# Patient Record
Sex: Female | Born: 1965 | Race: Black or African American | Hispanic: No | Marital: Married | Smoking: Never smoker
Health system: Southern US, Community
[De-identification: ages and names within clinical notes are randomized; demographics above are authoritative.]

## PROBLEM LIST (undated history)

## (undated) DIAGNOSIS — K219 Gastro-esophageal reflux disease without esophagitis: Secondary | ICD-10-CM

## (undated) DIAGNOSIS — E785 Hyperlipidemia, unspecified: Secondary | ICD-10-CM

## (undated) DIAGNOSIS — H409 Unspecified glaucoma: Secondary | ICD-10-CM

## (undated) DIAGNOSIS — I10 Essential (primary) hypertension: Secondary | ICD-10-CM

## (undated) HISTORY — DX: Hyperlipidemia, unspecified: E78.5

## (undated) HISTORY — DX: Unspecified glaucoma: H40.9

## (undated) HISTORY — PX: VAGINAL HYSTERECTOMY: SUR661

## (undated) HISTORY — DX: Gastro-esophageal reflux disease without esophagitis: K21.9

## (undated) HISTORY — DX: Essential (primary) hypertension: I10

---

## 1998-08-24 ENCOUNTER — Other Ambulatory Visit: Admission: RE | Admit: 1998-08-24 | Discharge: 1998-08-24 | Payer: Self-pay | Admitting: Gynecology

## 1999-06-10 ENCOUNTER — Other Ambulatory Visit: Admission: RE | Admit: 1999-06-10 | Discharge: 1999-06-10 | Payer: Self-pay | Admitting: Gynecology

## 1999-10-08 ENCOUNTER — Encounter: Admission: RE | Admit: 1999-10-08 | Discharge: 2000-01-06 | Payer: Self-pay | Admitting: Gynecology

## 2000-01-14 ENCOUNTER — Inpatient Hospital Stay (HOSPITAL_COMMUNITY): Admission: AD | Admit: 2000-01-14 | Discharge: 2000-01-14 | Payer: Self-pay | Admitting: Gynecology

## 2000-01-16 ENCOUNTER — Inpatient Hospital Stay (HOSPITAL_COMMUNITY): Admission: AD | Admit: 2000-01-16 | Discharge: 2000-01-18 | Payer: Self-pay | Admitting: Internal Medicine

## 2000-01-16 ENCOUNTER — Encounter (INDEPENDENT_AMBULATORY_CARE_PROVIDER_SITE_OTHER): Payer: Self-pay | Admitting: Specialist

## 2000-11-15 ENCOUNTER — Encounter: Admission: RE | Admit: 2000-11-15 | Discharge: 2000-11-15 | Payer: Self-pay | Admitting: Internal Medicine

## 2000-11-15 ENCOUNTER — Encounter: Payer: Self-pay | Admitting: Internal Medicine

## 2000-12-30 ENCOUNTER — Encounter: Payer: Self-pay | Admitting: Emergency Medicine

## 2000-12-30 ENCOUNTER — Emergency Department (HOSPITAL_COMMUNITY): Admission: EM | Admit: 2000-12-30 | Discharge: 2000-12-30 | Payer: Self-pay | Admitting: Emergency Medicine

## 2002-05-10 ENCOUNTER — Other Ambulatory Visit: Admission: RE | Admit: 2002-05-10 | Discharge: 2002-05-10 | Payer: Self-pay | Admitting: Gynecology

## 2003-05-15 ENCOUNTER — Other Ambulatory Visit: Admission: RE | Admit: 2003-05-15 | Discharge: 2003-05-15 | Payer: Self-pay | Admitting: Gynecology

## 2003-06-25 ENCOUNTER — Observation Stay (HOSPITAL_COMMUNITY): Admission: RE | Admit: 2003-06-25 | Discharge: 2003-06-26 | Payer: Self-pay | Admitting: Gynecology

## 2003-06-25 ENCOUNTER — Encounter (INDEPENDENT_AMBULATORY_CARE_PROVIDER_SITE_OTHER): Payer: Self-pay | Admitting: *Deleted

## 2005-08-02 ENCOUNTER — Encounter: Admission: RE | Admit: 2005-08-02 | Discharge: 2005-08-02 | Payer: Self-pay | Admitting: Family Medicine

## 2005-08-05 ENCOUNTER — Ambulatory Visit: Payer: Self-pay | Admitting: Family Medicine

## 2005-08-10 ENCOUNTER — Ambulatory Visit: Payer: Self-pay | Admitting: Family Medicine

## 2005-08-17 ENCOUNTER — Encounter: Admission: RE | Admit: 2005-08-17 | Discharge: 2005-08-17 | Payer: Self-pay | Admitting: Family Medicine

## 2005-10-26 ENCOUNTER — Ambulatory Visit: Payer: Self-pay | Admitting: Family Medicine

## 2006-07-10 ENCOUNTER — Ambulatory Visit: Payer: Self-pay | Admitting: Family Medicine

## 2006-10-09 ENCOUNTER — Ambulatory Visit: Payer: Self-pay | Admitting: Family Medicine

## 2006-10-17 ENCOUNTER — Ambulatory Visit: Payer: Self-pay | Admitting: Family Medicine

## 2006-10-17 LAB — CONVERTED CEMR LAB
ALT: 29 units/L (ref 0–40)
AST: 22 units/L (ref 0–37)
Alkaline Phosphatase: 54 units/L (ref 39–117)
Basophils Relative: 0.6 % (ref 0.0–1.0)
Bilirubin, Direct: 0.1 mg/dL (ref 0.0–0.3)
Chloride: 107 meq/L (ref 96–112)
Cholesterol: 227 mg/dL (ref 0–200)
Eosinophils Absolute: 0.1 10*3/uL (ref 0.0–0.6)
GFR calc Af Amer: 118 mL/min
GFR calc non Af Amer: 98 mL/min
HCT: 38 % (ref 36.0–46.0)
HDL: 49.4 mg/dL (ref >39.0)
Monocytes Absolute: 0.5 10*3/uL (ref 0.2–0.7)
Neutro Abs: 2.3 10*3/uL (ref 1.4–7.7)
Neutrophils Relative %: 49.7 % (ref 43.0–77.0)
RBC: 4.24 M/uL (ref 3.87–5.11)
Sodium: 142 meq/L (ref 135–145)
TSH: 4.41 microintl units/mL (ref 0.35–5.50)
Total CHOL/HDL Ratio: 4.6
Total Protein: 7.1 g/dL (ref 6.0–8.3)
VLDL: 29 mg/dL (ref 0–40)
WBC: 4.6 10*3/uL (ref 4.5–10.5)

## 2006-10-23 ENCOUNTER — Ambulatory Visit: Payer: Self-pay | Admitting: Family Medicine

## 2006-10-23 ENCOUNTER — Encounter: Payer: Self-pay | Admitting: Family Medicine

## 2006-10-23 DIAGNOSIS — I10 Essential (primary) hypertension: Secondary | ICD-10-CM | POA: Insufficient documentation

## 2006-10-23 DIAGNOSIS — E785 Hyperlipidemia, unspecified: Secondary | ICD-10-CM

## 2006-11-10 ENCOUNTER — Encounter: Admission: RE | Admit: 2006-11-10 | Discharge: 2006-11-10 | Payer: Self-pay | Admitting: Family Medicine

## 2006-12-18 ENCOUNTER — Telehealth: Payer: Self-pay | Admitting: Family Medicine

## 2007-01-10 ENCOUNTER — Telehealth: Payer: Self-pay | Admitting: Family Medicine

## 2007-01-26 ENCOUNTER — Ambulatory Visit: Payer: Self-pay | Admitting: Family Medicine

## 2007-01-29 ENCOUNTER — Telehealth (INDEPENDENT_AMBULATORY_CARE_PROVIDER_SITE_OTHER): Payer: Self-pay | Admitting: *Deleted

## 2007-01-29 LAB — CONVERTED CEMR LAB
Alkaline Phosphatase: 62 units/L (ref 39–117)
Cholesterol: 200 mg/dL (ref 0–200)
Total Bilirubin: 0.9 mg/dL (ref 0.3–1.2)
Total CHOL/HDL Ratio: 3.5
Total Protein: 7.2 g/dL (ref 6.0–8.3)
VLDL: 13 mg/dL (ref 0–40)

## 2007-02-22 ENCOUNTER — Telehealth: Payer: Self-pay | Admitting: Family Medicine

## 2007-03-16 ENCOUNTER — Ambulatory Visit (HOSPITAL_COMMUNITY): Admission: RE | Admit: 2007-03-16 | Discharge: 2007-03-16 | Payer: Self-pay | Admitting: Family Medicine

## 2007-04-18 ENCOUNTER — Ambulatory Visit: Payer: Self-pay | Admitting: Family Medicine

## 2007-04-18 DIAGNOSIS — K219 Gastro-esophageal reflux disease without esophagitis: Secondary | ICD-10-CM | POA: Insufficient documentation

## 2007-05-10 ENCOUNTER — Telehealth: Payer: Self-pay | Admitting: Family Medicine

## 2007-09-26 ENCOUNTER — Telehealth: Payer: Self-pay | Admitting: Family Medicine

## 2008-03-17 ENCOUNTER — Ambulatory Visit (HOSPITAL_COMMUNITY): Admission: RE | Admit: 2008-03-17 | Discharge: 2008-03-17 | Payer: Self-pay | Admitting: Family Medicine

## 2008-05-14 ENCOUNTER — Telehealth: Payer: Self-pay | Admitting: Family Medicine

## 2008-06-17 ENCOUNTER — Telehealth: Payer: Self-pay | Admitting: Family Medicine

## 2008-07-17 ENCOUNTER — Ambulatory Visit: Payer: Self-pay | Admitting: Family Medicine

## 2008-07-17 LAB — CONVERTED CEMR LAB
Ketones, urine, test strip: NEGATIVE
Nitrite: NEGATIVE
Specific Gravity, Urine: 1.02
Urobilinogen, UA: 0.2
WBC Urine, dipstick: NEGATIVE
pH: 7

## 2008-07-21 LAB — CONVERTED CEMR LAB
ALT: 34 units/L (ref 0–35)
AST: 27 units/L (ref 0–37)
Albumin: 4 g/dL (ref 3.5–5.2)
Alkaline Phosphatase: 51 units/L (ref 39–117)
Basophils Absolute: 0 10*3/uL (ref 0.0–0.1)
Calcium: 9.2 mg/dL (ref 8.4–10.5)
Creatinine, Ser: 0.7 mg/dL (ref 0.4–1.2)
Eosinophils Absolute: 0.1 10*3/uL (ref 0.0–0.7)
GFR calc Af Amer: 117 mL/min
GFR calc non Af Amer: 97 mL/min
Hemoglobin: 12.6 g/dL (ref 12.0–15.0)
Lymphocytes Relative: 41 % (ref 12.0–46.0)
MCV: 90.4 fL (ref 78.0–100.0)
Monocytes Absolute: 0.4 10*3/uL (ref 0.1–1.0)
Neutro Abs: 1.8 10*3/uL (ref 1.4–7.7)
Neutrophils Relative %: 46.8 % (ref 43.0–77.0)
Potassium: 3.6 meq/L (ref 3.5–5.1)
RBC: 4.14 M/uL (ref 3.87–5.11)
RDW: 13 % (ref 11.5–14.6)
Sodium: 140 meq/L (ref 135–145)
Total Bilirubin: 0.7 mg/dL (ref 0.3–1.2)
Total CHOL/HDL Ratio: 3.4
Triglycerides: 55 mg/dL (ref 0–149)
VLDL: 11 mg/dL (ref 0–40)

## 2008-07-22 ENCOUNTER — Ambulatory Visit: Payer: Self-pay | Admitting: Family Medicine

## 2008-07-22 DIAGNOSIS — E119 Type 2 diabetes mellitus without complications: Secondary | ICD-10-CM | POA: Insufficient documentation

## 2008-09-29 ENCOUNTER — Telehealth: Payer: Self-pay | Admitting: Family Medicine

## 2008-10-07 ENCOUNTER — Telehealth: Payer: Self-pay | Admitting: Family Medicine

## 2008-11-13 ENCOUNTER — Telehealth: Payer: Self-pay | Admitting: Family Medicine

## 2009-03-26 ENCOUNTER — Ambulatory Visit (HOSPITAL_COMMUNITY): Admission: RE | Admit: 2009-03-26 | Discharge: 2009-03-26 | Payer: Self-pay | Admitting: Family Medicine

## 2009-06-12 ENCOUNTER — Ambulatory Visit: Payer: Self-pay | Admitting: Family Medicine

## 2009-06-12 DIAGNOSIS — G5600 Carpal tunnel syndrome, unspecified upper limb: Secondary | ICD-10-CM | POA: Insufficient documentation

## 2009-08-11 ENCOUNTER — Ambulatory Visit: Payer: Self-pay | Admitting: Family Medicine

## 2009-08-11 LAB — CONVERTED CEMR LAB
Nitrite: NEGATIVE
Protein, U semiquant: NEGATIVE
Urobilinogen, UA: 0.2
WBC Urine, dipstick: NEGATIVE

## 2009-08-12 LAB — CONVERTED CEMR LAB
AST: 19 units/L (ref 0–37)
BUN: 11 mg/dL (ref 6–23)
Basophils Absolute: 0 10*3/uL (ref 0.0–0.1)
Calcium: 9.7 mg/dL (ref 8.4–10.5)
Chloride: 105 meq/L (ref 96–112)
Creatinine, Ser: 0.8 mg/dL (ref 0.4–1.2)
GFR calc non Af Amer: 99.59 mL/min (ref >60)
Glucose, Bld: 125 mg/dL — ABNORMAL HIGH (ref 70–99)
Hemoglobin: 12.1 g/dL (ref 12.0–15.0)
Lymphs Abs: 1.6 10*3/uL (ref 0.7–4.0)
MCV: 93.6 fL (ref 78.0–100.0)
Microalb Creat Ratio: 9.4 mg/g (ref 0.0–30.0)
Monocytes Relative: 9.4 % (ref 3.0–12.0)
Potassium: 4 meq/L (ref 3.5–5.1)
RBC: 3.93 M/uL (ref 3.87–5.11)
RDW: 13.5 % (ref 11.5–14.6)
TSH: 2.74 microintl units/mL (ref 0.35–5.50)
Total Bilirubin: 0.6 mg/dL (ref 0.3–1.2)
WBC: 4.5 10*3/uL (ref 4.5–10.5)

## 2009-08-25 ENCOUNTER — Ambulatory Visit: Payer: Self-pay | Admitting: Family Medicine

## 2009-08-25 LAB — CONVERTED CEMR LAB: Blood Glucose, Fingerstick: 91

## 2010-03-29 ENCOUNTER — Ambulatory Visit (HOSPITAL_COMMUNITY): Admission: RE | Admit: 2010-03-29 | Discharge: 2010-03-29 | Payer: Self-pay | Admitting: Family Medicine

## 2010-06-01 ENCOUNTER — Ambulatory Visit
Admission: RE | Admit: 2010-06-01 | Discharge: 2010-06-01 | Payer: Self-pay | Source: Home / Self Care | Attending: Family Medicine | Admitting: Family Medicine

## 2010-06-01 LAB — CONVERTED CEMR LAB
Blood in Urine, dipstick: NEGATIVE
Ketones, urine, test strip: NEGATIVE
Nitrite: NEGATIVE
Protein, U semiquant: NEGATIVE
WBC Urine, dipstick: NEGATIVE
pH: 6

## 2010-06-11 ENCOUNTER — Telehealth: Payer: Self-pay | Admitting: Family Medicine

## 2010-06-13 ENCOUNTER — Encounter: Payer: Self-pay | Admitting: Family Medicine

## 2010-06-24 NOTE — Assessment & Plan Note (Signed)
Summary: consult re: pain in wrist and elbow/cjr   Vital Signs:  Patient profile:   45 year old female Weight:      195 pounds BMI:     29.98 Temp:     98.3 degrees F oral Pulse rate:   76 / minute BP sitting:   104 / 72  (left arm) Cuff size:   large  Vitals Entered By: Alfred Levins, CMA (June 12, 2009 11:42 AM) CC: rt wrist and elbows feel "stiff"   History of Present Illness: Here for intermittent pains in the right elbow and right hand for the past 6 months. She spends a lot of her time on her computer for work. Sometine the right hand will get numb also. Today they feel fairly good. No swelling.   Current Medications (verified): 1)  Lisinopril-Hydrochlorothiazide 20-25 Mg  Tabs (Lisinopril-Hydrochlorothiazide) .... Once Daily 2)  Simvastatin 80 Mg Tabs (Simvastatin) .... Once Daily 3)  Metformin Hcl 500 Mg Tabs (Metformin Hcl) .... By Mouth Two Times A Day 4)  Freestyle Lite Test  Strp (Glucose Blood) .... Test Once Daily  Allergies (verified): No Known Drug Allergies  Past History:  Past Medical History: Reviewed history from 07/22/2008 and no changes required. Hyperlipidemia Hypertension Diabetes mellitus, type II GERD  Past Surgical History: Reviewed history from 07/22/2008 and no changes required. vaginal Hysterectomy, ovaries intact  Review of Systems  The patient denies anorexia, fever, weight loss, weight gain, vision loss, decreased hearing, hoarseness, chest pain, syncope, dyspnea on exertion, peripheral edema, prolonged cough, headaches, hemoptysis, abdominal pain, melena, hematochezia, severe indigestion/heartburn, hematuria, incontinence, genital sores, muscle weakness, suspicious skin lesions, transient blindness, difficulty walking, depression, unusual weight change, abnormal bleeding, enlarged lymph nodes, angioedema, breast masses, and testicular masses.    Physical Exam  General:  Well-developed,well-nourished,in no acute distress;  alert,appropriate and cooperative throughout examination Extremities:  tender over the right lateral epicondyle. No swelling, full ROM. The right hand is normal. negative Tinels and Phalens.  Neurologic:  strength normal in all extremities and sensation intact to light touch.     Impression & Recommendations:  Problem # 1:  LATERAL EPICONDYLITIS (ICD-726.32)  Problem # 2:  CARPAL TUNNEL SYNDROME (ICD-354.0)  Complete Medication List: 1)  Lisinopril-hydrochlorothiazide 20-25 Mg Tabs (Lisinopril-hydrochlorothiazide) .... Once daily 2)  Simvastatin 80 Mg Tabs (Simvastatin) .... Once daily 3)  Metformin Hcl 500 Mg Tabs (Metformin hcl) .... By mouth two times a day 4)  Freestyle Lite Test Strp (Glucose blood) .... Test once daily 5)  Aleve 220 Mg Tabs (Naproxen sodium) .Marland Kitchen.. 1-2 two times a day as needed  Patient Instructions: 1)  rest the right hand and arms as much as possible. use Aleeve. Wear a wrist spint.  2)  Please schedule a follow-up appointment as needed .

## 2010-06-24 NOTE — Assessment & Plan Note (Signed)
Summary: cpx/cjr/pt rescd//ccm   Vital Signs:  Patient profile:   45 year old female Height:      68.5 inches Weight:      193 pounds BMI:     29.02 Temp:     98.4 degrees F oral BP sitting:   118 / 72  (left arm) Cuff size:   regular  Vitals Entered By: Duard Brady LPN (August 25, 1608 1:54 PM) CC: cpx - doing well , pap ?? cant remember when it was done , mammo 2010 normal Is Patient Diabetic? Yes Did you bring your meter with you today? No CBG Result 91   History of Present Illness: 45 yr old female for cpx. She feels good in general, and she is proud of herself for getting her HTN and lipids and diabetes under good control. She does ask about something that she can take on an as needed basis for anxiety however. For the past year she has been under a lot of stress, since she is a mother and works a full time job. Her husband is still unemployed, and this adds a lot of financial stress to both of them. She sleeps well.   Preventive Screening-Counseling & Management  Alcohol-Tobacco     Smoking Status: quit  Allergies (verified): No Known Drug Allergies  Past History:  Past Medical History: Reviewed history from 07/22/2008 and no changes required. Hyperlipidemia Hypertension Diabetes mellitus, type II GERD  Past Surgical History: Reviewed history from 07/22/2008 and no changes required. vaginal Hysterectomy, ovaries intact  Family History: Reviewed history from 07/22/2008 and no changes required. Family History of Arthritis Family History of CAD Female 1st degree relative <50 Family History Diabetes 1st degree relative Family History High cholesterol Family History Hypertension Family History of Stroke M 1st degree relative <50  Social History: Reviewed history from 07/22/2008 and no changes required. Married Former Smoker Alcohol use-no  Review of Systems  The patient denies anorexia, fever, weight loss, weight gain, vision loss, decreased hearing,  hoarseness, chest pain, syncope, dyspnea on exertion, peripheral edema, prolonged cough, headaches, hemoptysis, abdominal pain, melena, hematochezia, severe indigestion/heartburn, hematuria, incontinence, genital sores, muscle weakness, suspicious skin lesions, transient blindness, difficulty walking, depression, unusual weight change, abnormal bleeding, enlarged lymph nodes, angioedema, breast masses, and testicular masses.    Physical Exam  General:  overweight-appearing.   Head:  Normocephalic and atraumatic without obvious abnormalities. No apparent alopecia or balding. Eyes:  No corneal or conjunctival inflammation noted. EOMI. Perrla. Funduscopic exam benign, without hemorrhages, exudates or papilledema. Vision grossly normal. Ears:  External ear exam shows no significant lesions or deformities.  Otoscopic examination reveals clear canals, tympanic membranes are intact bilaterally without bulging, retraction, inflammation or discharge. Hearing is grossly normal bilaterally. Nose:  External nasal examination shows no deformity or inflammation. Nasal mucosa are pink and moist without lesions or exudates. Mouth:  Oral mucosa and oropharynx without lesions or exudates.  Teeth in good repair. Neck:  No deformities, masses, or tenderness noted. Chest Wall:  No deformities, masses, or tenderness noted. Breasts:  No mass, nodules, thickening, tenderness, bulging, retraction, inflamation, nipple discharge or skin changes noted.   Lungs:  Normal respiratory effort, chest expands symmetrically. Lungs are clear to auscultation, no crackles or wheezes. Heart:  Normal rate and regular rhythm. S1 and S2 normal without gallop, murmur, click, rub or other extra sounds. Abdomen:  Bowel sounds positive,abdomen soft and non-tender without masses, organomegaly or hernias noted. Msk:  No deformity or scoliosis noted of thoracic or  lumbar spine.   Pulses:  R and L carotid,radial,femoral,dorsalis pedis and posterior  tibial pulses are full and equal bilaterally Extremities:  No clubbing, cyanosis, edema, or deformity noted with normal full range of motion of all joints.   Neurologic:  No cranial nerve deficits noted. Station and gait are normal. Plantar reflexes are down-going bilaterally. DTRs are symmetrical throughout. Sensory, motor and coordinative functions appear intact. Skin:  Intact without suspicious lesions or rashes Cervical Nodes:  No lymphadenopathy noted Axillary Nodes:  No palpable lymphadenopathy Inguinal Nodes:  No significant adenopathy Psych:  Cognition and judgment appear intact. Alert and cooperative with normal attention span and concentration. No apparent delusions, illusions, hallucinations   Impression & Recommendations:  Problem # 1:  WELL ADULT EXAM (ICD-V70.0)  Complete Medication List: 1)  Lisinopril-hydrochlorothiazide 20-25 Mg Tabs (Lisinopril-hydrochlorothiazide) .... Once daily 2)  Simvastatin 80 Mg Tabs (Simvastatin) .... Once daily 3)  Metformin Hcl 500 Mg Tabs (Metformin hcl) .... By mouth two times a day 4)  Freestyle Lite Test Strp (Glucose blood) .... Test once daily 5)  Aleve 220 Mg Tabs (Naproxen sodium) .Marland Kitchen.. 1-2 two times a day as needed 6)  Lorazepam 0.5 Mg Tabs (Lorazepam) .... Three times a day as needed anxiety  Other Orders: Capillary Blood Glucose/CBG ([address])  Patient Instructions: 1)  Try Lorazepam as needed . I congratulated her on her excellent dietary and exercise efforts.  2)  Schedule your mammogram.  3)  Please schedule a follow-up appointment in 6 months .  Prescriptions: METFORMIN HCL 500 MG TABS (METFORMIN HCL) by mouth two times a day  #60 x 11   Entered and Authorized by:   Nelwyn Salisbury MD   Signed by:   Nelwyn Salisbury MD on 08/25/2009   Method used:   Electronically to        [facility]  [phone]* (retail)       [address]       [place], [place]  [address]       Ph: [phone] or  [phone]       Fax: [phone]   RxID:   [id number] SIMVASTATIN 80 MG TABS (SIMVASTATIN) once daily  #30 x 11   Entered and Authorized by:   Nelwyn Salisbury MD   Signed by:   Nelwyn Salisbury MD on 08/25/2009   Method used:   Electronically to        [facility]  [phone]* (retail)       [address]       [place], [place]  [address]       Ph: [phone] or [phone]       Fax: [phone]   RxID:   [id number] LISINOPRIL-HYDROCHLOROTHIAZIDE 20-25 MG  TABS (LISINOPRIL-HYDROCHLOROTHIAZIDE) once daily  #30 x 11   Entered and Authorized by:   Nelwyn Salisbury MD   Signed by:   Nelwyn Salisbury MD on 08/25/2009   Method used:   Electronically to        [facility]  [phone]* (retail)       [address]       [place], [place]  [address]       Ph: [phone] or [phone]       Fax: [phone]   RxID:   [id number] LORAZEPAM 0.5 MG TABS (LORAZEPAM) three times a  day as needed anxiety  #60 x 2   Entered and Authorized by:   Nelwyn Salisbury MD   Signed by:   Nelwyn Salisbury MD on 08/25/2009   Method used:   Print then Give to Patient   RxID:   [id number]

## 2010-06-24 NOTE — Assessment & Plan Note (Signed)
Summary: some wheezing/ cold/pt unable to come in sooner/njr   Vital Signs:  Patient profile:   45 year old female Weight:      197 pounds O2 Sat:      97 % Temp:     98.5 degrees F Pulse rate:   97 / minute BP sitting:   120 / 80  (left arm)  Vitals Entered By: Pura Spice, RN (June 01, 2010 4:39 PM) CC: last wk been waking up wheezing and bring up yellow sputum and c/o burning irritation   wants to make sure does not have STD   History of Present Illness: Here for 2 weeks of sinus pressure, PND, ST, and a dry cough. No fever. She also complains of burning when she urinates. No urgency or frequency, no vaginal odor or DC.   Allergies (verified): No Known Drug Allergies  Past History:  Past Medical History: Reviewed history from 07/22/2008 and no changes required. Hyperlipidemia Hypertension Diabetes mellitus, type II GERD  Past Surgical History: Reviewed history from 07/22/2008 and no changes required. vaginal Hysterectomy, ovaries intact  Review of Systems  The patient denies anorexia, fever, weight loss, weight gain, vision loss, decreased hearing, hoarseness, chest pain, syncope, dyspnea on exertion, peripheral edema, hemoptysis, abdominal pain, melena, hematochezia, severe indigestion/heartburn, hematuria, incontinence, genital sores, muscle weakness, suspicious skin lesions, transient blindness, difficulty walking, depression, unusual weight change, abnormal bleeding, enlarged lymph nodes, angioedema, breast masses, and testicular masses.    Physical Exam  General:  Well-developed,well-nourished,in no acute distress; alert,appropriate and cooperative throughout examination Head:  Normocephalic and atraumatic without obvious abnormalities. No apparent alopecia or balding. Eyes:  No corneal or conjunctival inflammation noted. EOMI. Perrla. Funduscopic exam benign, without hemorrhages, exudates or papilledema. Vision grossly normal. Ears:  External ear exam shows  no significant lesions or deformities.  Otoscopic examination reveals clear canals, tympanic membranes are intact bilaterally without bulging, retraction, inflammation or discharge. Hearing is grossly normal bilaterally. Nose:  External nasal examination shows no deformity or inflammation. Nasal mucosa are pink and moist without lesions or exudates. Mouth:  Oral mucosa and oropharynx without lesions or exudates.  Teeth in good repair. Neck:  No deformities, masses, or tenderness noted. Lungs:  Normal respiratory effort, chest expands symmetrically. Lungs are clear to auscultation, no crackles or wheezes.   Impression & Recommendations:  Problem # 1:  ACUTE SINUSITIS, UNSPECIFIED (ICD-461.9)  Her updated medication list for this problem includes:    Zithromax Z-pak 250 Mg Tabs (Azithromycin) .Marland Kitchen... As directed  Problem # 2:  CANDIDIASIS, VAGINAL (ICD-112.1)  Her updated medication list for this problem includes:    Fluconazole 150 Mg Tabs (Fluconazole) .Marland Kitchen... As directed  Orders: UA Dipstick w/o Micro (manual) ([address])  Complete Medication List: 1)  Lisinopril-hydrochlorothiazide 20-25 Mg Tabs (Lisinopril-hydrochlorothiazide) .... Once daily 2)  Simvastatin 80 Mg Tabs (Simvastatin) .... Once daily 3)  Metformin Hcl 500 Mg Tabs (Metformin hcl) .... By mouth two times a day 4)  Freestyle Lite Test Strp (Glucose blood) .... Test once daily 5)  Aleve 220 Mg Tabs (Naproxen sodium) .Marland Kitchen.. 1-2 two times a day as needed 6)  Lorazepam 0.5 Mg Tabs (Lorazepam) .... Three times a day as needed anxiety 7)  Zithromax Z-pak 250 Mg Tabs (Azithromycin) .... As directed 8)  Fluconazole 150 Mg Tabs (Fluconazole) .... As directed  Patient Instructions: 1)  Please schedule a follow-up appointment as needed .  Prescriptions: FLUCONAZOLE 150 MG TABS (FLUCONAZOLE) as directed  #2 x 5   Entered and Authorized  by:   Nelwyn Salisbury MD   Signed by:   Nelwyn Salisbury MD on 06/01/2010   Method used:   Electronically  to        [facility]  [phone]* (retail)       [address]       [place], [place]  [address]       Ph: [phone] or [phone]       Fax: [phone]   RxID:   [id number] ZITHROMAX Z-PAK 250 MG TABS (AZITHROMYCIN) as directed  #1 x 0   Entered and Authorized by:   Nelwyn Salisbury MD   Signed by:   Nelwyn Salisbury MD on 06/01/2010   Method used:   Electronically to        [facility]  [phone]* (retail)       [address]       [place], [place]  [address]       Ph: [phone] or [phone]       Fax: [phone]   RxID:   [id number]    Orders Added: 1)  Est. Patient Level IV [83151] 2)  UA Dipstick w/o Micro (manual) [81002]    Laboratory Results   Urine Tests  Date/Time Received: June 01, 2010 4:53 PM  Date/Time Reported: 4:53 PM   Routine Urinalysis   Color: yellow Appearance: Clear Glucose: negative   (Normal Range: Negative) Bilirubin: negative   (Normal Range: Negative) Ketone: negative   (Normal Range: Negative) Spec. Gravity: 1.020   (Normal Range: 1.003-1.035) Blood: negative   (Normal Range: Negative) pH: 6.0   (Normal Range: 5.0-8.0) Protein: negative   (Normal Range: Negative) Urobilinogen: 0.2   (Normal Range: 0-1) Nitrite: negative   (Normal Range: Negative) Leukocyte Esterace: negative   (Normal Range: Negative)    Comments: Pura Spice, RN  June 01, 2010 4:53 PM

## 2010-06-24 NOTE — Progress Notes (Signed)
Summary: rx augmentin   Phone Note Call from Patient   Caller: Patient Call For: Nelwyn Salisbury MD Summary of Call: Pt's symptoms of sinus infection have returned since finishing Zpack last Sunday.  Would like a stronger antibiotic to [facility] ([address] [phone]. Initial call taken by: Debby Meadows CMA AAMA,  June 11, 2010 8:56 AM  Follow-up for Phone Call        call in Augmentin 875 two times a day for 10 days  Follow-up by: Verlia Kaney A Tim Corriher MD,  June 11, 2010 3:09 PM  Additional Follow-up for Phone Call Additional follow up Details #1::        pt aware med called Additional Follow-up by: Regina G Hudy, RN,  June 11, 2010 3:17 PM    New/Updated Medications: AUGMENTIN 875-125 MG TABS (AMOXICILLIN-POT CLAVULANATE) 1 by mouth two times a day Prescriptions: AUGMENTIN 875-125 MG TABS (AMOXICILLIN-POT CLAVULANATE) 1 by mouth two times a day  #20 x 0   Entered by:   Regina G Hudy, RN   Authorized by:   Tameko Halder A Camiah Humm MD   Signed by:   Regina G Hudy, RN on 06/11/2010   Method used:   Electronically to        [facility]  [id number]* (retail)       [address]       [place], [place]  [address]       Ph: [phone] or [phone]       Fax: [phone]   RxID:   [id number]

## 2010-08-28 ENCOUNTER — Other Ambulatory Visit: Payer: Self-pay | Admitting: Family Medicine

## 2010-09-20 ENCOUNTER — Other Ambulatory Visit: Payer: Self-pay | Admitting: Family Medicine

## 2010-10-05 NOTE — Assessment & Plan Note (Signed)
[facility] OFFICE NOTE   Brittany, [name]               MRN:          [id number]  DATE:10/23/2006                            DOB:          01-03-1966    This is a 45 year old woman here for a complete physical examination.  In general, she feels fine and has no complaints.  She admits to not  following her diet very closely and getting very little exercise.  We  have been following her for hyperlipidemia and hypertension primarily.  Of note, she is status post a hysterectomy.  She has not scheduled her  mammogram as yet for this year.  For other details of her past medical  history, family history, social habits, etc., refer the her last  physical note dated August 10, 2005.   ALLERGIES:  None.   CURRENT MEDICATIONS:  1. HCTZ 25 mg per day.  2. Simvastatin 40 mg per day.   OBJECTIVE:  VITAL SIGNS:  Height 5 feet, 8 inches.  Weight 210.  Blood  pressure 142/92, pulse 76 and regular.  GENERAL:  She is quite overweight.  SKIN:  Clear.  HEENT:  Eyes - sclerae are clear.  Oropharynx clear.  NECK:  Supple without thyromegaly or masses.  CARDIAC:  Rate and rhythm regular without gallops, murmurs or rubs.  Distal pulses are full.  BREASTS:  Breasts and axillae are clear.  ABDOMEN:  Soft, normal bowel sounds, nontender.  No masses.  EXTREMITIES:  No clubbing, cyanosis, or edema.  NEUROLOGIC:  Grossly intact.   LABORATORY DATA:  She was here for fasting laboratories on Oct 17, 2006.  This is remarkable for an abnormal lipid panel with an HDL of 49 but an  LDL of 134.  She also has new onset type 2 diabetes mellitus.  Her  fasting glucose is 143 with a hemoglobin A1c of 7.7.  Otherwise, her  labs were within normal limits.   ASSESSMENT AND PLAN:  1. Complete physical.  We talked about increasing exercise and losing      weight, and also reminded her to set up her mammogram soon.  2. New diagnosis of  type 2 diabetes mellitus.  Will refer her to      nutrition.  At this point, will try to manage it with aggressive      diet and exercise.  Will see her again in 3 months for follow up.  3. Hyperlipidemia.  Will continue on her current medication regimen,      but have the nutritionist      address this, as well.  Will check a lipid panel again in 3 months.  4. Hypertension.  With her new diagnosis of diabetes, will change from      HCTZ to Benicar HCT 20/12.5 to take once daily.  Will follow this      up in 3 months, as well.     Tera Mater. Clent Ridges, MD  Electronically Signed    SAF/MedQ  DD: 10/23/2006  DT: 10/23/2006  Job #: [id number]

## 2010-10-08 NOTE — Discharge Summary (Signed)
[facility]  Patient:    Brittany [name], Brittany [name]                    MRN: [id number] Adm. Date:  [id number] Disc. Date: [id number] Attending:  Tonye Royalty Dictator:   Antony Contras, [facility]                           Discharge Summary  DISCHARGE DIAGNOSES:          1. Intrauterine pregnancy at 39-5/7 weeks.                               2. History of advanced maternal age.                               3. Gestational diabetes controlled with insulin.  PROCEDURES:                   1. Normal spontaneous vaginal delivery over a                                  midline episiotomy.                               2. Postpartum tubal sterilization.  HISTORY OF PRESENT ILLNESS:   The patient is a 45 year old gravida 3, para 2-0-0-2 with an LMP of April 11, 1999 and an [facility] of January 16, 2000.  Her pregnancy was complicated by advanced maternal age.  The patient did decline genetic amnio.  MSAFP was within normal limits.  Also, gestational diabetes, which was controlled with insulin.  PRENATAL LABORATORY DATA:     Blood type AB positive.  Antibody screen negative.  Sickle cell negative.  RPR, HBSAG and HIV nonreactive.  Rubella immune.  HOSPITAL COURSE AND TREATMENT:                The patient was admitted on January 16, 2000 for induction of labor secondary to gestational diabetes.  On admission, the cervix was 3-4 cm, 80% and -1 station.  Blood sugar on admission 74.  She did progress to complete dilatation and delivered an Apgar 9 and 59 female infant weighing 7 pounds and 12 ounces.  She did request and postpartum tubal sterilization was performed post delivery.  Her postoperative course was benign.  She remained afebrile and had no difficulty voiding.  CBC showed hematocrit 29.3, hemoglobin 10, platelets 134.  She was able to be discharged on the second postpartum day in satisfactory condition.  DISPOSITION:                  Followup in six weeks.   Continue prenatal vitamins and iron.  Motrin and Tylox for pain. DD:  02/04/00 TD:  02/06/00 Job: [id number]

## 2010-10-08 NOTE — Discharge Summary (Signed)
NAME:  Brittany [name], Brittany [name]                       ACCOUNT NO.:  [id number]   MEDICAL RECORD NO.:  [id number]                   PATIENT TYPE:  OBV   LOCATION:  9318                                 FACILITY:  [facility]   PHYSICIAN:  Ivor Costa. Farrel Gobble, M.D.              DATE OF BIRTH:  1965-07-07   DATE OF ADMISSION:  06/25/2003  DATE OF DISCHARGE:  06/26/2003                                 DISCHARGE SUMMARY   DISCHARGE DIAGNOSIS:  Symptomatic uterine polyp, menorrhagia, dysmenorrhea,  and dyspareunia.   PROCEDURE:  Lap assisted vaginal hysterectomy.   HISTORY OF PRESENT ILLNESS:  [age]-year-old gravida 4 para 3  contracepting with tubal ligation with complaints of dysmenorrhea,  menorrhagia, dyspareunia.  Patient had been found to have an endometrial  polyp, was offered a hysteroscopy and requested a hysterectomy instead.  Patient states she does have problems with cramping throughout the month and  low abdominal pain as well.  Menses are monthly and they last 5-7 days.   HOSPITAL COURSE:  Patient presented to the hospital on June 25, 2003 for  lap assisted vaginal hysterectomy.  She does smoke a half a pack cigarettes  per day, did well postoperatively and was discharged home on postop day #1.   LABORATORIES:  White count 9.3, hemoglobin 12.4, hematocrit 36, and  platelets 187.   DISPOSITION:  Patient was discharged home, instructed to follow up in the  office in 2 weeks, a pain prescription was given preoperatively,  instructions were given.     Brittany [name], N.P.                      Ivor Costa. Farrel Gobble, M.D.    [name]  D:  07/23/2003  T:  07/23/2003  Job:  [id number]

## 2010-10-08 NOTE — Op Note (Signed)
[facility]  Patient:    Brittany [name], Brittany [name]                    MRN: [id number] Adm. Date:  [id number] Attending:  Tonye Royalty                           Operative Report  INDICATIONS:                  The patient is a 45 year old gravida 3, para 3, status post normal spontaneous vaginal delivery on August 26 requesting elective permanent sterilization. The patient had previously been counseled prenatally, as well as today. Risks, benefits, pros and cons, failure rate, potential complications were all discussed. The patient understands, and ______ to have any more children.  PREOPERATIVE DIAGNOSIS:       Request for immediate postpartum tubal sterilization procedure.  POSTOPERATIVE DIAGNOSIS:      Request for immediate postpartum tubal sterilization procedure.  PROCEDURE PERFORMED:          Bilateral tubal sterilization procedure, Pomeroy technique postpartum.  ANESTHESIA:                   Epidural.  DESCRIPTION OF OPERATION:     After the patient was adequately counseled, she was taken to the operating room. She was placed in a supine position. The abdomen was prepped and draped in the usual sterile fashion. She had been redosed from her epidural and still had some sensitivity in the site of the incision and 1/4% Marcaine 10 cc were infiltrated subcutaneously. After this was accomplished, a semilunar incision was made underneath the umbilicus. The incision was carried down from the skin, subcutaneous tissue and down to the rectus fascia and into the peritoneal cavity. With the use of [employer] retractors for exposure, the left fallopian tube was brought into view and grasped with the ______ clamp and identified to its distal portion. The proximal 1/3 portion was suture ligated with 3-0 plain catgut suture x 2, and this 2 cm segment was excised off the operative field for histological evaluation. The remaining stump was Bovie  cauterized. Similar procedure was carried out on the contralateral side. After ascertaining adequate hemostasis, the fascia was closed with a running stitch of 3-0 Vicryl suture and the subcutaneous tissue was reapproximated with 4-0 plain catgut suture. Blood loss from the procedure was minimal. Fluid resuscitation consisted of 700 cc of lactated Ringers. The patient did receive 1 g of Cefotan preoperatively. She was transferred to the recovery room with stable vital signs. DD:  01/16/00 TD:  01/17/00 Job: [id number]

## 2010-10-08 NOTE — H&P (Signed)
NAME:  Brittany [name], Brittany [name]                       ACCOUNT NO.:  [id number]   MEDICAL RECORD NO.:  [id number]                   PATIENT TYPE:  OBV   LOCATION:  NA                                   FACILITY:  [facility]   PHYSICIAN:  Ivor Costa. Farrel Gobble, M.D.              DATE OF BIRTH:  01-Nov-1965   DATE OF ADMISSION:  DATE OF DISCHARGE:                                HISTORY & PHYSICAL   CHIEF COMPLAINT:  Dysmenorrhea, dyspareunia and menorrhagia.   HISTORY OF PRESENT ILLNESS:  The patient is a 45 year old Gravida IV, Para 3  contracepting with a tubal ligation who complains of dysmenorrhea and  menorrhagia.  The patient was found to have an endometrial polyp.  The  patient states, however, that she also has dyspareunia sometimes severe  enough that it causes her to avoid intercourse and while offered  hysteroscopy had requested perhaps a hysterectomy instead.  The patient  states that sometimes she will cramp throughout the month.  The pain is  pretty much in the lower quadrant on the left, but not always so.  The  patient states that her menses are monthly.  She flows for five to seven  days with clots.   GYN HISTORY:  1. Status post tubal ligation.  2. Three pregnancies.  3. Pregnancy was complicated also by diabetes.   PAST MEDICAL HISTORY:  1. High cholesterol.  2. Gastroesophageal reflux disease.   MEDICATIONS:  1. Protonix.  2. Lovastatin.   PAST SURGICAL HISTORY:  1. Tubal ligation in 2001.   ALLERGIES:  No known drug allergies.   SOCIAL HISTORY:  She smokes roughly a pack per day.  Social alcohol.  No  formal exercise.  Daily caffeine.   FAMILY HISTORY:  Positive for uterine cancer in her grandmother although not  specific.   PHYSICAL EXAMINATION:  GENERAL:  She is well appearing and in no acute  distress.  HEART:  Regular rate.  LUNGS:  Clear to auscultation.  BREASTS:  Without masses, discharge or retractions in the supine upright  position.  ABDOMEN:  Soft and  non-tender without rebound or guarding.  GYN EXAM:  She has a parous introitus.  BUS are negative.  The vagina had a  slight amount of discharge.  The cervix is parous.  The uterus was bulky and  non-tender.  The adnexa are without fullness.  RECTOVAGINAL EXAM:  Confirmatory.   Ultrasound shows her uterus to be slightly bulky withdrawn distinct fibroids  at 9.6 x 5.1 x 6.3 cm with a small echogenic focus in the lower segment.  The adnexa are negative.   ASSESSMENT:  History of dysmenorrhea, menorrhagia and dyspareunia.   PLAN:  The patient would prefer to have a vaginal hysterectomy instead of a  hysteroscopy. We will add a laparoscopic portion to that to rule out any  scar tissue of either fimbria, tubal ligation or endometriosis as the tubal  ligation was done postpartum.  The risks of damage to underlying major  vessels, bladder, ureter and bowel were discussed and accepted.  Because we  cannot rule out endometriosis also as a source, the patient will be bowel  prepped.  All questions were addressed and she will present on the morning  of June 25, 2003 for definitive surgery.                                               Ivor Costa. Farrel Gobble, M.D.    Brittany [name]  D:  06/24/2003  T:  06/24/2003  Job:  [id number]

## 2010-10-08 NOTE — Op Note (Signed)
NAME:  Brittany [name], Brittany [name]                       ACCOUNT NO.:  [id number]   MEDICAL RECORD NO.:  [id number]                   PATIENT TYPE:  OBV   LOCATION:  9318                                 FACILITY:  [facility]   PHYSICIAN:  Ivor Costa. Farrel Gobble, M.D.              DATE OF BIRTH:  1966/05/13   DATE OF PROCEDURE:  06/25/2003  DATE OF DISCHARGE:                                 OPERATIVE REPORT   PREOPERATIVE DIAGNOSES:  1. Dysmenorrhea.  2. Dyspareunia.  3. Menorrhagia.  4. Possible endometrial polyp.   POSTOPERATIVE DIAGNOSES:  1. Dysmenorrhea.  2. Dyspareunia.  3. Menorrhagia.  4. Possible endometrial polyp.   PROCEDURES:  1. Diagnostic laparoscopy.  2. Total vaginal hysterectomy.   SURGEON:  Ivor Costa. Farrel Gobble, M.D.   ASSISTANT:  Rande Brunt. Eda Paschal, M.D.   ANESTHESIA:  General.   ESTIMATED BLOOD LOSS:  Less than 100.   FLUIDS REPLACED:  2 L lactated Ringer's.   URINE OUTPUT:  175 mL of clear urine.   FINDINGS:  The laparoscopic portion:  There is no evidence of endometriosis.  There was no scarring from the previous tubal ligation.  The uterus was  bulky and otherwise mobile.  The lining on the cavity appeared to be  possibly secretory.   PATHOLOGY:  Uterus and cervix.   DESCRIPTION OF PROCEDURE:  The patient was taken to the operating room and  general anesthesia was induced, placed in the dorsal lithotomy position and  prepped and draped in the usual sterile fashion.  A bivalve speculum was  placed in the vagina, the cervix was visualized and stabilized with a single-  tooth tenaculum, after which an infraumbilical incision was made with the  scalpel, going through the previous tubal ligation scar.  The Veress needle  was inserted and free flow of fluid was noted.  Insufflation was continued  until tympany was appreciated above the liver, after which a 10/11  disposable trocar was inserted through the infraumbilical port.  The  placement in the cavity was confirmed.   Once adequate distention was  recreated, the patient was placed in deep Trendelenburg position.  The  uterus was elevated.  It was note to be mobile.  Evidence of the previous  tubal ligation was noted.  Otherwise there was no gross disease that would  be suspicious for cause of her dyspareunia.  Her uterus was bulky.  The gas  was then released, the legs were elevated but not sharply flexed on the  abdominal wall.  A sterile weighted speculum was placed in the vagina.  A  Jacobs tenaculum was placed.  The vaginal mucosa was then injected, a total  of 10 mL of a dilute Pitressin solution.  The vaginal mucosa was then scored  circumferentially around the cervix and gently dissected off.  A posterior  colpotomy was then performed, after which a long sterile weighted speculum  was placed in the vagina.  The uterosacral  ligaments were clamped, suture  ligated with 0 Vicryl, and held for later identification.  Cardinal  ligaments were then also clamped, suture ligated with 0 Vicryl.  An anterior  colpotomy was then performed.  With the puff of gas, we were assured that we  were in the abdominal cavity.  The uterines were then clamped and doubly  suture ligated with 0 Vicryl.  The dissection carried through until the  round and tubo-ovarian ligaments were crossclamped and the specimen was  removed from the cavity.  The tubo-ovarian, round pedicles were, first a  free tie of 0 Vicryl was placed around them, followed by a suture ligature.  This was done bilaterally.  The bowels were then packed away.  The pedicles  were inspected.  The pelvis was irrigated with warm saline.  Pedicles were  noted to be hemostatic.  There was only some minimal bleeding at the cuff.  The posterior cuff was plicated to the peritoneum from just superior to the  uterosacral to just superior to the uterosacral, and this seemed to control  any vaginal bleeding.  The upper pedicles were then cut.  A 2-0 plain was  placed  posteriorly in order to create for a [employer], which was  tied at the end of the procedure.  The vaginal mucosa was then plicated and  noted to be hemostatic afterwards.  The McCall culdoplasty stitch was then  tied.  The patient tolerated the procedure well.  We noticed no further  bleeding in the vagina.  The legs were lowered and the diagnostic scope was  then removed.  The fascia was grasped and closed with a figure-of-eight of 0  Vicryl.  The skin naturally fell together and was therefore closed with  Dermabond.  The patient tolerated the procedure well, sponge, lap, and  needle counts correct x2, and she was transferred to the PACU in stable  condition.                                               Ivor Costa. Farrel Gobble, M.D.    Leda Roys  D:  06/25/2003  T:  06/25/2003  Job:  [id number]

## 2010-12-28 ENCOUNTER — Other Ambulatory Visit: Payer: Self-pay | Admitting: Family Medicine

## 2010-12-28 NOTE — Telephone Encounter (Signed)
Last seen 05/27/10 last written 05/27/10 #60 5RF Please advise

## 2010-12-29 NOTE — Telephone Encounter (Signed)
Call in #60 with 5 rf 

## 2011-01-17 ENCOUNTER — Other Ambulatory Visit (INDEPENDENT_AMBULATORY_CARE_PROVIDER_SITE_OTHER): Payer: 59

## 2011-01-17 DIAGNOSIS — Z0000 Encounter for general adult medical examination without abnormal findings: Secondary | ICD-10-CM

## 2011-01-17 LAB — MICROALBUMIN / CREATININE URINE RATIO
Creatinine,U: 123.2 mg/dL
Microalb Creat Ratio: 0.5 mg/g (ref 0.0–30.0)

## 2011-01-17 LAB — CBC WITH DIFFERENTIAL/PLATELET
Basophils Relative: 0.4 % (ref 0.0–3.0)
Eosinophils Relative: 3.7 % (ref 0.0–5.0)
HCT: 39.4 % (ref 36.0–46.0)
Hemoglobin: 13.3 g/dL (ref 12.0–15.0)
Lymphs Abs: 1.5 10*3/uL (ref 0.7–4.0)
MCV: 94 fl (ref 78.0–100.0)
Monocytes Absolute: 0.3 10*3/uL (ref 0.1–1.0)
Monocytes Relative: 8.7 % (ref 3.0–12.0)
RBC: 4.2 Mil/uL (ref 3.87–5.11)
WBC: 4 10*3/uL — ABNORMAL LOW (ref 4.5–10.5)

## 2011-01-17 LAB — HEPATIC FUNCTION PANEL
AST: 26 U/L (ref 0–37)
Alkaline Phosphatase: 50 U/L (ref 39–117)
Total Bilirubin: 0.3 mg/dL (ref 0.3–1.2)

## 2011-01-17 LAB — BASIC METABOLIC PANEL
BUN: 15 mg/dL (ref 6–23)
CO2: 30 mEq/L (ref 19–32)
Chloride: 99 mEq/L (ref 96–112)
Creatinine, Ser: 1 mg/dL (ref 0.4–1.2)
Potassium: 4 mEq/L (ref 3.5–5.1)

## 2011-01-17 LAB — POCT URINALYSIS DIPSTICK
Blood, UA: NEGATIVE
Glucose, UA: NEGATIVE
Nitrite, UA: NEGATIVE
Protein, UA: NEGATIVE
Spec Grav, UA: 1.025
Urobilinogen, UA: 0.2

## 2011-01-17 LAB — LIPID PANEL
Total CHOL/HDL Ratio: 4
VLDL: 28 mg/dL (ref 0.0–40.0)

## 2011-01-17 LAB — TSH: TSH: 1.67 u[IU]/mL (ref 0.35–5.50)

## 2011-01-17 LAB — HEMOGLOBIN A1C: Hgb A1c MFr Bld: 6.6 % — ABNORMAL HIGH (ref 4.6–6.5)

## 2011-01-21 ENCOUNTER — Encounter: Payer: Self-pay | Admitting: Family Medicine

## 2011-01-21 ENCOUNTER — Telehealth: Payer: Self-pay | Admitting: Family Medicine

## 2011-01-21 MED ORDER — ATORVASTATIN CALCIUM 80 MG PO TABS: 80.0000 mg | ORAL_TABLET | Freq: Every day | ORAL | Status: DC

## 2011-01-21 NOTE — Telephone Encounter (Signed)
Message copied by Baldemar Friday on Fri Jan 21, 2011  9:34 AM ------      Message from: Gershon Crane A      Created: Wed Jan 19, 2011  8:38 AM       Normal except her chol is not well controlled. Stop Simvastatin and switch to Lipitor 80 mg a day. Call in one year supply. Recheck lipids and liver panel in 90 days

## 2011-01-21 NOTE — Telephone Encounter (Signed)
New script sent e-scribe and left message for pt to stop the Simvastatin when she picks up the new medication.

## 2011-01-21 NOTE — Telephone Encounter (Signed)
Left a voice message with below info. Advised pt to call back and let me know that she understands the new directions. I will call new script to drug store.

## 2011-01-25 NOTE — Telephone Encounter (Signed)
Spoke with pt and gave results. 

## 2011-01-27 ENCOUNTER — Encounter: Payer: Self-pay | Admitting: Family Medicine

## 2011-01-27 ENCOUNTER — Ambulatory Visit (INDEPENDENT_AMBULATORY_CARE_PROVIDER_SITE_OTHER): Payer: 59 | Admitting: Family Medicine

## 2011-01-27 VITALS — BP 110/78 | HR 94 | Temp 98.6°F | Ht 67.0 in | Wt 175.0 lb

## 2011-01-27 DIAGNOSIS — Z0000 Encounter for general adult medical examination without abnormal findings: Secondary | ICD-10-CM

## 2011-01-27 MED ORDER — METFORMIN HCL 500 MG PO TABS: 500.0000 mg | ORAL_TABLET | ORAL | Status: DC

## 2011-01-27 MED ORDER — CITALOPRAM HYDROBROMIDE 20 MG PO TABS: 20.0000 mg | ORAL_TABLET | Freq: Every day | ORAL | Status: DC

## 2011-01-27 MED ORDER — LISINOPRIL-HYDROCHLOROTHIAZIDE 20-25 MG PO TABS: 1.0000 | ORAL_TABLET | Freq: Every day | ORAL | Status: DC

## 2011-01-27 MED ORDER — ATORVASTATIN CALCIUM 40 MG PO TABS: 40.0000 mg | ORAL_TABLET | Freq: Every day | ORAL | Status: DC

## 2011-01-27 NOTE — Progress Notes (Signed)
  Subjective:    Patient ID: Brittany [name], female    DOB: November 14, 1965, 45 y.o.   MRN: [id number]  HPI 45 yr old female for a cpx. She feels fine except for daily anxiety. She separated from her husband earlier this year, and this has been very stressful for her to work through. They are trying to work things out but it has been difficult. She has had to use a lot of Lorazepam to get by. Her glucoses have been well controlled and in fact she has had a fair amount of low glucoses throughout the day, dropping into the 60s or 70s. She feels weak and shaky when this happens, and she needs to eat something. She eats very light during the day but then has a heavier dinner at night.    Review of Systems  Constitutional: Negative.   HENT: Negative.   Eyes: Negative.   Respiratory: Negative.   Cardiovascular: Negative.   Gastrointestinal: Negative.   Genitourinary: Negative for dysuria, urgency, frequency, hematuria, flank pain, decreased urine volume, enuresis, difficulty urinating, pelvic pain and dyspareunia.  Musculoskeletal: Negative.   Skin: Negative.   Neurological: Negative.   Hematological: Negative.   Psychiatric/Behavioral: Negative.        Objective:   Physical Exam  Constitutional: She is oriented to person, place, and time. She appears well-developed and well-nourished. No distress.  HENT:  Head: Normocephalic and atraumatic.  Right Ear: External ear normal.  Left Ear: External ear normal.  Nose: Nose normal.  Mouth/Throat: Oropharynx is clear and moist. No oropharyngeal exudate.  Eyes: Conjunctivae and EOM are normal. Pupils are equal, round, and reactive to light. No scleral icterus.  Neck: Normal range of motion. Neck supple. No JVD present. No thyromegaly present.  Cardiovascular: Normal rate, regular rhythm, normal heart sounds and intact distal pulses.  Exam reveals no gallop and no friction rub.   No murmur heard. Pulmonary/Chest: Effort normal and breath sounds  normal. No respiratory distress. She has no wheezes. She has no rales. She exhibits no tenderness.  Abdominal: Soft. Bowel sounds are normal. She exhibits no distension and no mass. There is no tenderness. There is no rebound and no guarding.  Genitourinary: No breast swelling, tenderness, discharge or bleeding.  Musculoskeletal: Normal range of motion. She exhibits no edema and no tenderness.  Lymphadenopathy:    She has no cervical adenopathy.  Neurological: She is alert and oriented to person, place, and time. She has normal reflexes. No cranial nerve deficit. She exhibits normal muscle tone. Coordination normal.  Skin: Skin is warm and dry. No rash noted. No erythema.  Psychiatric: She has a normal mood and affect. Her behavior is normal. Judgment and thought content normal.          Assessment & Plan:  We will start her on Celexa daily for the anxiety. Decrease the Metformin to an evening dose only. Change to Lipitor for the lipids. Recheck labs in 90 days

## 2011-07-04 ENCOUNTER — Other Ambulatory Visit: Payer: Self-pay | Admitting: Family Medicine

## 2011-07-05 NOTE — Telephone Encounter (Signed)
Call in 6 months of each

## 2011-07-06 NOTE — Telephone Encounter (Signed)
Called in.

## 2011-12-31 ENCOUNTER — Other Ambulatory Visit: Payer: Self-pay | Admitting: Family Medicine

## 2012-01-24 ENCOUNTER — Other Ambulatory Visit: Payer: Self-pay | Admitting: Family Medicine

## 2012-02-05 ENCOUNTER — Other Ambulatory Visit: Payer: Self-pay | Admitting: Family Medicine

## 2012-02-07 NOTE — Telephone Encounter (Signed)
Rx last filled 07/04/11 [id number].  Pt last seen 01/27/11. Please advise.

## 2012-02-07 NOTE — Telephone Encounter (Signed)
Call in #60 with 5 rf 

## 2012-02-08 NOTE — Telephone Encounter (Signed)
Call in #60 with 5 rf 

## 2012-02-09 MED ORDER — LORAZEPAM 0.5 MG PO TABS: 0.5000 mg | ORAL_TABLET | Freq: Three times a day (TID) | ORAL | Status: DC | PRN

## 2012-02-09 NOTE — Addendum Note (Signed)
Addended by: Alfred Levins D on: 02/09/2012 03:58 PM   Modules accepted: Orders

## 2012-02-09 NOTE — Telephone Encounter (Signed)
rx called into pharmacy

## 2012-02-29 ENCOUNTER — Other Ambulatory Visit: Payer: Self-pay | Admitting: Family Medicine

## 2012-03-21 ENCOUNTER — Other Ambulatory Visit: Payer: Self-pay | Admitting: Family Medicine

## 2012-03-21 DIAGNOSIS — Z1231 Encounter for screening mammogram for malignant neoplasm of breast: Secondary | ICD-10-CM

## 2012-03-26 ENCOUNTER — Ambulatory Visit (HOSPITAL_COMMUNITY)
Admission: RE | Admit: 2012-03-26 | Discharge: 2012-03-26 | Disposition: A | Discharge status: Home / Self Care | Payer: 59 | Source: Ambulatory Visit | Attending: Family Medicine | Admitting: Family Medicine

## 2012-03-26 DIAGNOSIS — Z1231 Encounter for screening mammogram for malignant neoplasm of breast: Secondary | ICD-10-CM | POA: Insufficient documentation

## 2012-04-04 ENCOUNTER — Other Ambulatory Visit (INDEPENDENT_AMBULATORY_CARE_PROVIDER_SITE_OTHER): Payer: 59

## 2012-04-04 DIAGNOSIS — Z0000 Encounter for general adult medical examination without abnormal findings: Secondary | ICD-10-CM

## 2012-04-04 LAB — BASIC METABOLIC PANEL
Calcium: 9.6 mg/dL (ref 8.4–10.5)
GFR: 98.45 mL/min (ref >60.00)
Glucose, Bld: 138 mg/dL — ABNORMAL HIGH (ref 70–99)
Sodium: 135 mEq/L (ref 135–145)

## 2012-04-04 LAB — CBC WITH DIFFERENTIAL/PLATELET
Basophils Absolute: 0 10*3/uL (ref 0.0–0.1)
Hemoglobin: 13.9 g/dL (ref 12.0–15.0)
Lymphocytes Relative: 39.5 % (ref 12.0–46.0)
Monocytes Relative: 10.5 % (ref 3.0–12.0)
Neutro Abs: 1.9 10*3/uL (ref 1.4–7.7)
RDW: 14.3 % (ref 11.5–14.6)
WBC: 3.9 10*3/uL — ABNORMAL LOW (ref 4.5–10.5)

## 2012-04-04 LAB — POCT URINALYSIS DIPSTICK
Leukocytes, UA: NEGATIVE
Protein, UA: NEGATIVE
Urobilinogen, UA: 0.2

## 2012-04-04 LAB — HEPATIC FUNCTION PANEL
ALT: 42 U/L — ABNORMAL HIGH (ref 0–35)
AST: 31 U/L (ref 0–37)
Total Bilirubin: 0.7 mg/dL (ref 0.3–1.2)
Total Protein: 7.8 g/dL (ref 6.0–8.3)

## 2012-04-04 LAB — MICROALBUMIN / CREATININE URINE RATIO
Creatinine,U: 123.9 mg/dL
Microalb Creat Ratio: 1 mg/g (ref 0.0–30.0)
Microalb, Ur: 1.3 mg/dL (ref 0.0–1.9)

## 2012-04-04 LAB — LIPID PANEL
HDL: 61.7 mg/dL (ref >39.00)
Triglycerides: 120 mg/dL (ref 0.0–149.0)

## 2012-04-04 LAB — LDL CHOLESTEROL, DIRECT: Direct LDL: 175.1 mg/dL

## 2012-04-04 LAB — HEMOGLOBIN A1C: Hgb A1c MFr Bld: 6.4 % (ref 4.6–6.5)

## 2012-04-09 ENCOUNTER — Encounter: Payer: Self-pay | Admitting: Family Medicine

## 2012-04-09 ENCOUNTER — Ambulatory Visit (INDEPENDENT_AMBULATORY_CARE_PROVIDER_SITE_OTHER): Payer: 59 | Admitting: Family Medicine

## 2012-04-09 VITALS — BP 110/74 | HR 100 | Temp 98.6°F | Ht 67.5 in | Wt 182.0 lb

## 2012-04-09 DIAGNOSIS — Z0000 Encounter for general adult medical examination without abnormal findings: Secondary | ICD-10-CM

## 2012-04-09 MED ORDER — LORAZEPAM 1 MG PO TABS: 1.0000 mg | ORAL_TABLET | Freq: Two times a day (BID) | ORAL | Status: DC | PRN

## 2012-04-09 MED ORDER — METFORMIN HCL 500 MG PO TABS: 500.0000 mg | ORAL_TABLET | Freq: Every day | ORAL | Status: DC

## 2012-04-09 MED ORDER — LISINOPRIL-HYDROCHLOROTHIAZIDE 20-25 MG PO TABS: 1.0000 | ORAL_TABLET | Freq: Every day | ORAL | Status: DC

## 2012-04-09 MED ORDER — ATORVASTATIN CALCIUM 80 MG PO TABS: 80.0000 mg | ORAL_TABLET | Freq: Every day | ORAL | Status: DC

## 2012-04-09 NOTE — Progress Notes (Signed)
Quick Note:  Pt is here today for a CPE and Dr. Clent Ridges will go over results. ______

## 2012-04-09 NOTE — Progress Notes (Signed)
  Subjective:    Patient ID: Brittany [name], female    DOB: [birth year], 46 y.o.   MRN: [id number]  HPI 46 yr old female for a cpx. She feels well in general. She could not tolerate Celexa, so she just uses Lorazepam prn.      Review of Systems  Constitutional: Negative.   HENT: Negative.   Eyes: Negative.   Respiratory: Negative.   Cardiovascular: Negative.   Gastrointestinal: Negative.   Genitourinary: Negative for dysuria, urgency, frequency, hematuria, flank pain, decreased urine volume, enuresis, difficulty urinating, pelvic pain and dyspareunia.  Musculoskeletal: Negative.   Skin: Negative.   Neurological: Negative.   Hematological: Negative.   Psychiatric/Behavioral: Negative.        Objective:   Physical Exam  Constitutional: She is oriented to person, place, and time. She appears well-developed and well-nourished. No distress.  HENT:  Head: Normocephalic and atraumatic.  Right Ear: External ear normal.  Left Ear: External ear normal.  Nose: Nose normal.  Mouth/Throat: Oropharynx is clear and moist. No oropharyngeal exudate.  Eyes: Conjunctivae normal and EOM are normal. Pupils are equal, round, and reactive to light. No scleral icterus.  Neck: Normal range of motion. Neck supple. No JVD present. No thyromegaly present.  Cardiovascular: Normal rate, regular rhythm, normal heart sounds and intact distal pulses.  Exam reveals no gallop and no friction rub.   No murmur heard. Pulmonary/Chest: Effort normal and breath sounds normal. No respiratory distress. She has no wheezes. She has no rales. She exhibits no tenderness.  Abdominal: Soft. Bowel sounds are normal. She exhibits no distension and no mass. There is no tenderness. There is no rebound and no guarding.  Genitourinary:       Breasts are normal   Musculoskeletal: Normal range of motion. She exhibits no edema and no tenderness.  Lymphadenopathy:    She has no cervical adenopathy.  Neurological: She is  alert and oriented to person, place, and time. She has normal reflexes. No cranial nerve deficit. She exhibits normal muscle tone. Coordination normal.  Skin: Skin is warm and dry. No rash noted. No erythema.  Psychiatric: She has a normal mood and affect. Her behavior is normal. Judgment and thought content normal.          Assessment & Plan:  Well exam.

## 2012-06-28 ENCOUNTER — Telehealth: Payer: Self-pay | Admitting: Family Medicine

## 2012-06-28 NOTE — Telephone Encounter (Signed)
Patient Information:  Caller Name: [place]  Phone: [phone]  Patient: Brittany [name], Brittany [name]  Gender: Female  DOB: 1965/06/18  Age: 47 Years  PCP: Gershon Crane [facility])  Pregnant: No  Office Follow Up:  Does the office need to follow up with this patient?: No  Instructions For The Office: N/A  RN Note:  Has Appt 9:30 am tomorrow 5/7 but was hoping to get antibiotic so would not have to come to office tomorrow.  Symptoms  Reason For Call & Symptoms: Sinus congestion 2 weeks, pressure behind eyes, ear pain, ear draining.   Chills at intervals for 3-4 days but not taken temperature.  Reviewed Health History In EMR: Yes  Reviewed Medications In EMR: Yes  Reviewed Allergies In EMR: Yes  Reviewed Surgeries / Procedures: Yes  Date of Onset of Symptoms: 06/14/2012 OB / GYN:  LMP: Unknown  Guideline(s) Used:  Sinus Pain and Congestion  Disposition Per Guideline:   See Today in Office  Reason For Disposition Reached:   Earache  Advice Given:  Pain and Fever Medicines:  For pain or fever relief, take either acetaminophen or ibuprofen.  Hydration:  Drink plenty of liquids (6-8 glasses of water daily). If the air in your home is dry, use a cool mist humidifier  Call Back If:   Severe pain lasts longer than 2 hours after pain medicine  You become worse.  Patient Refused Recommendation:  Patient Will Follow Up With Office Later  Has Appt for 9:30 am tomorrow 5/7 for evaluation

## 2012-06-29 ENCOUNTER — Ambulatory Visit (INDEPENDENT_AMBULATORY_CARE_PROVIDER_SITE_OTHER): Payer: 59 | Admitting: Family Medicine

## 2012-06-29 ENCOUNTER — Encounter: Payer: Self-pay | Admitting: Family Medicine

## 2012-06-29 VITALS — BP 110/68 | HR 74 | Temp 98.0°F | Wt 184.0 lb

## 2012-06-29 DIAGNOSIS — J329 Chronic sinusitis, unspecified: Secondary | ICD-10-CM

## 2012-06-29 MED ORDER — FLUCONAZOLE 150 MG PO TABS: 150.0000 mg | ORAL_TABLET | ORAL | Status: DC | PRN

## 2012-06-29 MED ORDER — AMOXICILLIN-POT CLAVULANATE 875-125 MG PO TABS: 1.0000 | ORAL_TABLET | Freq: Two times a day (BID) | ORAL | Status: DC

## 2012-06-29 NOTE — Telephone Encounter (Signed)
She needs to keep the OV

## 2012-06-29 NOTE — Progress Notes (Signed)
  Subjective:    Patient ID: Brittany [name], female    DOB: [birth year], 47 y.o.   MRN: [id number]  HPI Here for 2 weeks of sinus pressure, PND, HA, left ear pain, and a dry cough. No fever. On Mucinex D.    Review of Systems  Constitutional: Negative.   HENT: Positive for ear pain, congestion, postnasal drip and sinus pressure. Negative for hearing loss and tinnitus.   Eyes: Negative.   Respiratory: Positive for cough.        Objective:   Physical Exam  Constitutional: She appears well-developed and well-nourished.  HENT:  Right Ear: External ear normal.  Left Ear: External ear normal.  Nose: Nose normal.  Mouth/Throat: Oropharynx is clear and moist.  Eyes: Conjunctivae normal are normal.  Pulmonary/Chest: Effort normal and breath sounds normal.  Lymphadenopathy:    She has no cervical adenopathy.          Assessment & Plan:  Recheck prn

## 2012-07-11 ENCOUNTER — Encounter: Payer: Self-pay | Admitting: Family Medicine

## 2012-11-14 ENCOUNTER — Other Ambulatory Visit: Payer: Self-pay | Admitting: Family Medicine

## 2012-11-14 NOTE — Telephone Encounter (Signed)
Call in #[phone] with one rf  

## 2013-03-28 ENCOUNTER — Other Ambulatory Visit: Payer: Self-pay

## 2013-04-26 ENCOUNTER — Other Ambulatory Visit: Payer: Self-pay | Admitting: Family Medicine

## 2013-04-29 ENCOUNTER — Other Ambulatory Visit: Payer: Self-pay | Admitting: Family Medicine

## 2013-05-08 ENCOUNTER — Other Ambulatory Visit: Payer: Self-pay | Admitting: Family Medicine

## 2013-05-08 DIAGNOSIS — Z1231 Encounter for screening mammogram for malignant neoplasm of breast: Secondary | ICD-10-CM

## 2013-06-08 ENCOUNTER — Other Ambulatory Visit: Payer: Self-pay | Admitting: Family Medicine

## 2013-06-11 ENCOUNTER — Other Ambulatory Visit: Payer: Self-pay | Admitting: Family Medicine

## 2013-06-11 DIAGNOSIS — Z1231 Encounter for screening mammogram for malignant neoplasm of breast: Secondary | ICD-10-CM

## 2013-06-13 ENCOUNTER — Ambulatory Visit (HOSPITAL_COMMUNITY)
Admission: RE | Admit: 2013-06-13 | Discharge: 2013-06-13 | Disposition: A | Discharge status: Home / Self Care | Payer: 59 | Source: Ambulatory Visit | Attending: Family Medicine | Admitting: Family Medicine

## 2013-06-13 DIAGNOSIS — Z1231 Encounter for screening mammogram for malignant neoplasm of breast: Secondary | ICD-10-CM

## 2013-06-14 ENCOUNTER — Other Ambulatory Visit (INDEPENDENT_AMBULATORY_CARE_PROVIDER_SITE_OTHER): Payer: 59

## 2013-06-14 DIAGNOSIS — Z0000 Encounter for general adult medical examination without abnormal findings: Secondary | ICD-10-CM

## 2013-06-14 LAB — POCT URINALYSIS DIPSTICK
Bilirubin, UA: NEGATIVE
Blood, UA: NEGATIVE
Glucose, UA: NEGATIVE
Ketones, UA: NEGATIVE
LEUKOCYTES UA: NEGATIVE
Nitrite, UA: NEGATIVE
PROTEIN UA: NEGATIVE
Spec Grav, UA: 1.02
Urobilinogen, UA: 0.2
pH, UA: 6

## 2013-06-14 LAB — LIPID PANEL
Cholesterol: 235 mg/dL — ABNORMAL HIGH (ref 0–200)
HDL: 56.4 mg/dL (ref >39.00)
Total CHOL/HDL Ratio: 4
Triglycerides: 167 mg/dL — ABNORMAL HIGH (ref 0.0–149.0)
VLDL: 33.4 mg/dL (ref 0.0–40.0)

## 2013-06-14 LAB — CBC WITH DIFFERENTIAL/PLATELET
BASOS PCT: 0.5 % (ref 0.0–3.0)
Basophils Absolute: 0 10*3/uL (ref 0.0–0.1)
EOS ABS: 0.1 10*3/uL (ref 0.0–0.7)
Eosinophils Relative: 1.4 % (ref 0.0–5.0)
HEMATOCRIT: 37.9 % (ref 36.0–46.0)
HEMOGLOBIN: 12.8 g/dL (ref 12.0–15.0)
LYMPHS ABS: 1.7 10*3/uL (ref 0.7–4.0)
Lymphocytes Relative: 40.3 % (ref 12.0–46.0)
MCHC: 33.7 g/dL (ref 30.0–36.0)
MCV: 89.1 fl (ref 78.0–100.0)
Monocytes Absolute: 0.3 10*3/uL (ref 0.1–1.0)
Monocytes Relative: 8 % (ref 3.0–12.0)
NEUTROS ABS: 2.1 10*3/uL (ref 1.4–7.7)
Neutrophils Relative %: 49.8 % (ref 43.0–77.0)
Platelets: 184 10*3/uL (ref 150.0–400.0)
RBC: 4.26 Mil/uL (ref 3.87–5.11)
RDW: 14.6 % (ref 11.5–14.6)
WBC: 4.1 10*3/uL — ABNORMAL LOW (ref 4.5–10.5)

## 2013-06-14 LAB — BASIC METABOLIC PANEL
BUN: 13 mg/dL (ref 6–23)
CALCIUM: 9.7 mg/dL (ref 8.4–10.5)
CO2: 31 mEq/L (ref 19–32)
Chloride: 99 mEq/L (ref 96–112)
Creatinine, Ser: 0.8 mg/dL (ref 0.4–1.2)
GFR: 99.39 mL/min (ref >60.00)
GLUCOSE: 187 mg/dL — AB (ref 70–99)
Potassium: 4.2 mEq/L (ref 3.5–5.1)
SODIUM: 139 meq/L (ref 135–145)

## 2013-06-14 LAB — HEPATIC FUNCTION PANEL
ALT: 54 U/L — AB (ref 0–35)
AST: 28 U/L (ref 0–37)
Albumin: 4 g/dL (ref 3.5–5.2)
Alkaline Phosphatase: 80 U/L (ref 39–117)
BILIRUBIN DIRECT: 0.1 mg/dL (ref 0.0–0.3)
BILIRUBIN TOTAL: 0.7 mg/dL (ref 0.3–1.2)
TOTAL PROTEIN: 7.4 g/dL (ref 6.0–8.3)

## 2013-06-14 LAB — TSH: TSH: 2.81 u[IU]/mL (ref 0.35–5.50)

## 2013-06-14 LAB — MICROALBUMIN / CREATININE URINE RATIO
Creatinine,U: 141.5 mg/dL
Microalb Creat Ratio: 0.4 mg/g (ref 0.0–30.0)
Microalb, Ur: 0.5 mg/dL (ref 0.0–1.9)

## 2013-06-14 LAB — HEMOGLOBIN A1C: Hgb A1c MFr Bld: 8.6 % — ABNORMAL HIGH (ref 4.6–6.5)

## 2013-06-14 LAB — LDL CHOLESTEROL, DIRECT: LDL DIRECT: 161.7 mg/dL

## 2013-06-18 MED ORDER — METFORMIN HCL 1000 MG PO TABS
1000.0000 mg | ORAL_TABLET | Freq: Two times a day (BID) | ORAL | Status: DC
Start: 2013-06-18 — End: 2014-02-03

## 2013-06-18 NOTE — Addendum Note (Signed)
Addended by: Aniceto BossNIMMONS, SYLVIA A on: 06/18/2013 03:24 PM   Modules accepted: Orders, Medications

## 2013-06-27 ENCOUNTER — Ambulatory Visit (INDEPENDENT_AMBULATORY_CARE_PROVIDER_SITE_OTHER): Payer: 59 | Admitting: Family Medicine

## 2013-06-27 ENCOUNTER — Encounter: Payer: Self-pay | Admitting: Family Medicine

## 2013-06-27 VITALS — BP 130/84 | HR 82 | Temp 98.0°F | Ht 68.0 in | Wt 195.0 lb

## 2013-06-27 DIAGNOSIS — M1990 Unspecified osteoarthritis, unspecified site: Secondary | ICD-10-CM

## 2013-06-27 DIAGNOSIS — Z0000 Encounter for general adult medical examination without abnormal findings: Secondary | ICD-10-CM

## 2013-06-27 DIAGNOSIS — E119 Type 2 diabetes mellitus without complications: Secondary | ICD-10-CM

## 2013-06-27 DIAGNOSIS — M129 Arthropathy, unspecified: Secondary | ICD-10-CM

## 2013-06-27 MED ORDER — GLUCOSE BLOOD VI STRP: ORAL_STRIP | Status: DC

## 2013-06-27 MED ORDER — MELOXICAM 7.5 MG PO TABS: 7.5000 mg | ORAL_TABLET | Freq: Every day | ORAL | Status: DC

## 2013-06-27 NOTE — Progress Notes (Signed)
   Subjective:    Patient ID: Brittany [name], female    DOB: 04/28/1966, 48 y.o.   MRN: [id number]  HPI 48 yr old female for a cpx. She has a few issues to discuss. First she has had a lot of joint pains in the past year and she seems to be developing some arthritis. The right knee has some torn cartilage ans she is seeing Dr. Otelia SergeantNitka for this. She has received a steroid injection and they are considering shots of Synvisc soon. This has kept her from exercising and she has gained weight. Her diabetes has been poorly controlled and her A1c went up to 8.6. She has been having hot flashes and trouble sleeping, but she does not want to try HRT.    Review of Systems  Constitutional: Negative.   HENT: Negative.   Eyes: Negative.   Respiratory: Negative.   Cardiovascular: Negative.   Gastrointestinal: Negative.   Genitourinary: Negative for dysuria, urgency, frequency, hematuria, flank pain, decreased urine volume, enuresis, difficulty urinating, pelvic pain and dyspareunia.  Musculoskeletal: Positive for arthralgias. Negative for back pain, gait problem, joint swelling, myalgias, neck pain and neck stiffness.  Skin: Negative.   Neurological: Negative.   Psychiatric/Behavioral: Negative.        Objective:   Physical Exam  Constitutional: She is oriented to person, place, and time. She appears well-developed and well-nourished. No distress.  HENT:  Head: Normocephalic and atraumatic.  Right Ear: External ear normal.  Left Ear: External ear normal.  Nose: Nose normal.  Mouth/Throat: Oropharynx is clear and moist. No oropharyngeal exudate.  Eyes: Conjunctivae and EOM are normal. Pupils are equal, round, and reactive to light. No scleral icterus.  Neck: Normal range of motion. Neck supple. No JVD present. No thyromegaly present.  Cardiovascular: Normal rate, regular rhythm, normal heart sounds and intact distal pulses.  Exam reveals no gallop and no friction rub.   No murmur  heard. Pulmonary/Chest: Effort normal and breath sounds normal. No respiratory distress. She has no wheezes. She has no rales. She exhibits no tenderness.  Abdominal: Soft. Bowel sounds are normal. She exhibits no distension and no mass. There is no tenderness. There is no rebound and no guarding.  Musculoskeletal: Normal range of motion. She exhibits no edema and no tenderness.  Lymphadenopathy:    She has no cervical adenopathy.  Neurological: She is alert and oriented to person, place, and time. She has normal reflexes. No cranial nerve deficit. She exhibits normal muscle tone. Coordination normal.  Skin: Skin is warm and dry. No rash noted. No erythema.  Psychiatric: She has a normal mood and affect. Her behavior is normal. Judgment and thought content normal.          Assessment & Plan:  Well exam. We will increase Metformin to 1000 mg bid. Get arthritis labs. Try Melatonin 5 mg qhs for sleep. Try black cohosh for the hot flashes.

## 2013-06-27 NOTE — Progress Notes (Signed)
Pre visit review using our clinic review tool, if applicable. No additional management support is needed unless otherwise documented below in the visit note. 

## 2013-06-28 ENCOUNTER — Telehealth: Payer: Self-pay

## 2013-06-28 NOTE — Telephone Encounter (Signed)
Relevant patient education assigned to patient using Emmi. ° °

## 2013-07-12 ENCOUNTER — Encounter: Payer: Self-pay | Admitting: Family Medicine

## 2013-07-22 ENCOUNTER — Other Ambulatory Visit: Payer: Self-pay | Admitting: Family Medicine

## 2013-09-03 ENCOUNTER — Telehealth: Payer: Self-pay | Admitting: Family Medicine

## 2013-09-03 NOTE — Telephone Encounter (Signed)
Pt request refill atorvastatin (LIPITOR) 80 MG tablet [facility].  Pt is out of meds 3 days

## 2013-09-05 MED ORDER — ATORVASTATIN CALCIUM 80 MG PO TABS: ORAL_TABLET | ORAL | Status: DC

## 2013-09-05 NOTE — Telephone Encounter (Signed)
I sent script e-scribe and left a voice message for pt. 

## 2013-10-18 ENCOUNTER — Ambulatory Visit: Payer: 59

## 2013-10-18 ENCOUNTER — Other Ambulatory Visit (INDEPENDENT_AMBULATORY_CARE_PROVIDER_SITE_OTHER): Payer: 59

## 2013-10-18 DIAGNOSIS — E119 Type 2 diabetes mellitus without complications: Secondary | ICD-10-CM

## 2013-10-18 DIAGNOSIS — E785 Hyperlipidemia, unspecified: Secondary | ICD-10-CM

## 2013-10-18 LAB — LIPID PANEL
CHOLESTEROL: 194 mg/dL (ref 0–200)
HDL: 48.4 mg/dL (ref >39.00)
LDL Cholesterol: 116 mg/dL — ABNORMAL HIGH (ref 0–99)
TRIGLYCERIDES: 148 mg/dL (ref 0.0–149.0)
Total CHOL/HDL Ratio: 4
VLDL: 29.6 mg/dL (ref 0.0–40.0)

## 2013-10-18 LAB — HEMOGLOBIN A1C: HEMOGLOBIN A1C: 8.2 % — AB (ref 4.6–6.5)

## 2013-10-21 MED ORDER — GLIPIZIDE 10 MG PO TABS: 10.0000 mg | ORAL_TABLET | Freq: Two times a day (BID) | ORAL | Status: DC

## 2013-10-24 ENCOUNTER — Telehealth: Payer: Self-pay | Admitting: Family Medicine

## 2013-10-24 NOTE — Telephone Encounter (Signed)
Pt is calling about her lab results. She has updated her contact information as well.

## 2013-10-25 NOTE — Telephone Encounter (Signed)
I spoke with pt and gave results.  

## 2013-12-30 ENCOUNTER — Telehealth: Payer: Self-pay | Admitting: *Deleted

## 2013-12-30 DIAGNOSIS — E119 Type 2 diabetes mellitus without complications: Secondary | ICD-10-CM

## 2013-12-30 NOTE — Telephone Encounter (Signed)
Patient to call back and schedule an appointment for follow up diabetes A1c, bmet, microalbumin Diabetic bundle

## 2014-01-27 ENCOUNTER — Other Ambulatory Visit: Payer: Self-pay | Admitting: Family Medicine

## 2014-01-29 NOTE — Telephone Encounter (Signed)
Call in #[phone] with one rf  

## 2014-02-03 ENCOUNTER — Encounter (HOSPITAL_COMMUNITY): Payer: Self-pay | Admitting: Emergency Medicine

## 2014-02-03 ENCOUNTER — Emergency Department (HOSPITAL_COMMUNITY): Payer: 59

## 2014-02-03 ENCOUNTER — Emergency Department (HOSPITAL_COMMUNITY)
Admission: EM | Admit: 2014-02-03 | Discharge: 2014-02-03 | Disposition: A | Discharge status: Home / Self Care | Payer: 59

## 2014-02-03 ENCOUNTER — Emergency Department (HOSPITAL_COMMUNITY)
Admission: EM | Admit: 2014-02-03 | Discharge: 2014-02-03 | Disposition: A | Discharge status: Home / Self Care | Payer: 59 | Attending: Emergency Medicine | Admitting: Emergency Medicine

## 2014-02-03 DIAGNOSIS — R079 Chest pain, unspecified: Secondary | ICD-10-CM | POA: Insufficient documentation

## 2014-02-03 DIAGNOSIS — E119 Type 2 diabetes mellitus without complications: Secondary | ICD-10-CM | POA: Insufficient documentation

## 2014-02-03 DIAGNOSIS — E785 Hyperlipidemia, unspecified: Secondary | ICD-10-CM | POA: Diagnosis not present

## 2014-02-03 DIAGNOSIS — K219 Gastro-esophageal reflux disease without esophagitis: Secondary | ICD-10-CM | POA: Insufficient documentation

## 2014-02-03 DIAGNOSIS — I10 Essential (primary) hypertension: Secondary | ICD-10-CM | POA: Insufficient documentation

## 2014-02-03 DIAGNOSIS — R0789 Other chest pain: Secondary | ICD-10-CM | POA: Diagnosis not present

## 2014-02-03 DIAGNOSIS — Z79899 Other long term (current) drug therapy: Secondary | ICD-10-CM | POA: Insufficient documentation

## 2014-02-03 DIAGNOSIS — Z7982 Long term (current) use of aspirin: Secondary | ICD-10-CM | POA: Insufficient documentation

## 2014-02-03 LAB — D-DIMER, QUANTITATIVE (NOT AT ARMC)

## 2014-02-03 LAB — BASIC METABOLIC PANEL
Anion gap: 14 (ref 5–15)
BUN: 12 mg/dL (ref 6–23)
CHLORIDE: 94 meq/L — AB (ref 96–112)
CO2: 27 meq/L (ref 19–32)
Calcium: 10 mg/dL (ref 8.4–10.5)
Creatinine, Ser: 0.76 mg/dL (ref 0.50–1.10)
GFR calc Af Amer: >90 mL/min (ref >90)
GFR calc non Af Amer: >90 mL/min (ref >90)
GLUCOSE: 185 mg/dL — AB (ref 70–99)
Potassium: 3.7 mEq/L (ref 3.7–5.3)
Sodium: 135 mEq/L — ABNORMAL LOW (ref 137–147)

## 2014-02-03 LAB — CBC
HEMATOCRIT: 36.9 % (ref 36.0–46.0)
HEMOGLOBIN: 12.2 g/dL (ref 12.0–15.0)
MCH: 28.6 pg (ref 26.0–34.0)
MCHC: 33.1 g/dL (ref 30.0–36.0)
MCV: 86.6 fL (ref 78.0–100.0)
Platelets: 231 10*3/uL (ref 150–400)
RBC: 4.26 MIL/uL (ref 3.87–5.11)
RDW: 13.8 % (ref 11.5–15.5)
WBC: 5.4 10*3/uL (ref 4.0–10.5)

## 2014-02-03 LAB — PRO B NATRIURETIC PEPTIDE: Pro B Natriuretic peptide (BNP): 16.7 pg/mL (ref 0–125)

## 2014-02-03 LAB — I-STAT TROPONIN, ED: TROPONIN I, POC: 0 ng/mL (ref 0.00–0.08)

## 2014-02-03 MED ORDER — ASPIRIN 81 MG PO CHEW: 81.0000 mg | CHEWABLE_TABLET | Freq: Every day | ORAL | Status: AC

## 2014-02-03 MED ORDER — PANTOPRAZOLE SODIUM 20 MG PO TBEC: 20.0000 mg | DELAYED_RELEASE_TABLET | Freq: Every day | ORAL | Status: DC

## 2014-02-03 MED ORDER — ASPIRIN EC 81 MG PO TBEC
81.0000 mg | DELAYED_RELEASE_TABLET | Freq: Once | ORAL | Status: AC
  Administered 2014-02-03: 81 mg via ORAL
  Filled 2014-02-03: qty 1

## 2014-02-03 MED ORDER — PANTOPRAZOLE SODIUM 40 MG PO TBEC
40.0000 mg | DELAYED_RELEASE_TABLET | Freq: Once | ORAL | Status: AC
  Administered 2014-02-03: 40 mg via ORAL
  Filled 2014-02-03: qty 1

## 2014-02-03 NOTE — Discharge Instructions (Signed)

## 2014-02-03 NOTE — ED Notes (Addendum)
Pt called x2 no answer, per REG patient LWBS

## 2014-02-03 NOTE — ED Provider Notes (Signed)
CSN: [id number]     Arrival date & time 02/03/14  1738 History   First MD Initiated Contact with Patient 02/03/14 2038     Chief Complaint  Patient presents with  . Chest Pain     (Consider location/radiation/quality/duration/timing/severity/associated sxs/prior Treatment) HPI The patient presents with a two-day history of left-sided chest pain. She reports it has both a burning and a sharp quality to it. It has a waxing and waning in nature. It was there yesterday evening much of the evening. She reports this morning it was improved she then ate breakfast and shortly after it resumed again. She pushes felt mildly short of breath for several days or maybe even longer preceding the onset of pain. She doesn't directly associated shortness of breath with the pain. She denies is made significantly worse by any position changes deep breath or cough. She reports a very modest amount of coughing no sputum. No recent travel history or surgical history.  Past Medical History  Diagnosis Date  . GERD (gastroesophageal reflux disease)   . Hyperlipidemia   . Hypertension   . Diabetes mellitus     type II   Past Surgical History  Procedure Laterality Date  . Vaginal hysterectomy      ovaries intact   Family History  Problem Relation Age of Onset  . Arthritis Other   . Heart disease Other   . Diabetes Other   . Hyperlipidemia Other   . Hypertension Other   . Stroke Other    History  Substance Use Topics  . Smoking status: Never Smoker   . Smokeless tobacco: Never Used  . Alcohol Use: Yes     Comment: 2   OB History   Grav Para Term Preterm Abortions TAB SAB Ect Mult Living                 Review of Systems 10 Systems reviewed and are negative for acute change except as noted in the HPI.  Family history; no early onset of coronary artery disease or sudden death. Both parents are living with control medical problems. Patient has 2 siblings also both living without early coronary  disease sudden death or PE.   Allergies  Review of patient's allergies indicates no known allergies.  Home Medications   Prior to Admission medications   Medication Sig Start Date End Date Taking? Authorizing Provider  atorvastatin (LIPITOR) 80 MG tablet TAKE ONE TABLET BY MOUTH EVERY DAY 09/05/13  Yes Nelwyn Salisbury, MD  lisinopril-hydrochlorothiazide (PRINZIDE,ZESTORETIC) 20-25 MG per tablet TAKE ONE TABLET BY MOUTH ONCE DAILY 07/22/13  Yes Nelwyn Salisbury, MD  metFORMIN (GLUCOPHAGE) 1000 MG tablet Take 1,000 mg by mouth 2 (two) times daily with a meal.   Yes Historical Provider, MD  aspirin 81 MG chewable tablet Chew 1 tablet (81 mg total) by mouth daily. 02/03/14   Arby Barrette, MD  glipiZIDE (GLUCOTROL) 10 MG tablet Take 1 tablet (10 mg total) by mouth 2 (two) times daily before a meal. 10/21/13   Nelwyn Salisbury, MD  pantoprazole (PROTONIX) 20 MG tablet Take 1 tablet (20 mg total) by mouth daily. 02/03/14   Arby Barrette, MD   BP 114/67  Pulse 75  Temp(Src) 98.3 F (36.8 C) (Oral)  Resp 18  SpO2 100% Physical Exam  Constitutional: She is oriented to person, place, and time. She appears well-developed and well-nourished.  HENT:  Head: Normocephalic and atraumatic.  Eyes: EOM are normal. Pupils are equal, round, and reactive to light.  Neck: Neck supple.  Cardiovascular: Normal rate, regular rhythm, normal heart sounds and intact distal pulses.   Pulmonary/Chest: Effort normal and breath sounds normal.  Abdominal: Soft. Bowel sounds are normal. She exhibits no distension. There is no tenderness.  Musculoskeletal: Normal range of motion. She exhibits no edema.  Neurological: She is alert and oriented to person, place, and time. She has normal strength. Coordination normal. GCS eye subscore is 4. GCS verbal subscore is 5. GCS motor subscore is 6.  Skin: Skin is warm, dry and intact.  Psychiatric: She has a normal mood and affect.     ED Course  Procedures (including critical care  time) Labs Review Labs Reviewed  BASIC METABOLIC PANEL - Abnormal; Notable for the following:    Sodium 135 (*)    Chloride 94 (*)    Glucose, Bld 185 (*)    All other components within normal limits  CBC  PRO B NATRIURETIC PEPTIDE  D-DIMER, QUANTITATIVE  I-STAT TROPOININ, ED  CBG MONITORING, ED    Imaging Review Dg Chest 2 View  02/03/2014   CLINICAL DATA:  Chest pain, cough  EXAM: CHEST  2 VIEW  COMPARISON:  None.  FINDINGS: Lungs are clear.  No pleural effusion or pneumothorax.  The heart is normal in size.  Mild degenerative changes of the visualized thoracolumbar spine.  IMPRESSION: No evidence of acute cardiopulmonary disease.   Electronically Signed   By: Charline Bills M.D.   On: 02/03/2014 19:39     EKG Interpretation   Date/Time:  Monday February 03 2014 18:12:32 EDT Ventricular Rate:  73 PR Interval:  208 QRS Duration: 90 QT Interval:  408 QTC Calculation: 450 R Axis:   13 Text Interpretation:  Sinus rhythm Borderline prolonged PR interval no  STEMI Confirmed by Donnald Garre, MD, Lebron Conners [phone]) on 02/03/2014 8:40:46 PM      MDM   Final diagnoses:  Chest pain of uncertain etiology    At this point time diagnostic workup is negative. The patient has 2 days of atypical type chest pain. I have reviewed the plan with her she will start on a daily aspirin as well as protonix. The patient will be calling her family physician tomorrow to discuss scheduling outpatient stress test.    Arby Barrette, MD 02/03/14 2241

## 2014-02-03 NOTE — ED Notes (Signed)
Pt states that she has had L sided burning/sharp pain in her chest x several days along with occasional L arm numbness and SOB. Alert and oriented. NAD at this time.

## 2014-02-04 ENCOUNTER — Telehealth: Payer: Self-pay | Admitting: Family Medicine

## 2014-02-04 LAB — CBG MONITORING, ED: GLUCOSE-CAPILLARY: 143 mg/dL — AB (ref 70–99)

## 2014-02-04 NOTE — Telephone Encounter (Signed)
Per Dr. Clent Ridges pt will need to make a ER follow up and will discuss all issues at that time.

## 2014-02-04 NOTE — Telephone Encounter (Signed)
Pt went to ed last night w/ chest pains. Hospital advised pt to have  pcp schedule a stress test. pls advise if need to make appt  Pt was rx   pantoprazole (PROTONIX) 20 MG tablet

## 2014-02-04 NOTE — Telephone Encounter (Signed)
Pt prefers to get referral for stress test w;out having to come see dr fry.  However, she states dr fry had wanted her to get labs. Pt wil cb and sch lab appt them fup for everything at that time. Pt needs to check her schedule first.

## 2014-02-04 NOTE — Telephone Encounter (Signed)
NO I will not order a stress test sight unseen. She needs to see me so I can determine what the best course of action would be

## 2014-02-05 NOTE — Telephone Encounter (Signed)
Pt needs to schedule a office visit

## 2014-02-05 NOTE — Telephone Encounter (Signed)
Pt aware and will cb once she checks her schedule

## 2014-04-28 ENCOUNTER — Telehealth: Payer: Self-pay | Admitting: Family Medicine

## 2014-04-28 MED ORDER — METFORMIN HCL 1000 MG PO TABS: 1000.0000 mg | ORAL_TABLET | Freq: Two times a day (BID) | ORAL | Status: DC

## 2014-04-28 NOTE — Telephone Encounter (Signed)
Refill request for Metformin 1000 mg take 1 po bid and send to [facility]. I did send script e-scribe.

## 2014-06-06 ENCOUNTER — Encounter: Payer: Self-pay | Admitting: Family Medicine

## 2014-06-06 ENCOUNTER — Ambulatory Visit (INDEPENDENT_AMBULATORY_CARE_PROVIDER_SITE_OTHER): Payer: 59 | Admitting: Family Medicine

## 2014-06-06 VITALS — BP 107/74 | HR 96 | Temp 98.6°F

## 2014-06-06 DIAGNOSIS — N390 Urinary tract infection, site not specified: Secondary | ICD-10-CM

## 2014-06-06 LAB — POCT URINALYSIS DIPSTICK
BILIRUBIN UA: NEGATIVE
Glucose, UA: NEGATIVE
NITRITE UA: NEGATIVE
Spec Grav, UA: >=1.03
Urobilinogen, UA: 0.2
pH, UA: 6

## 2014-06-06 MED ORDER — LISINOPRIL-HYDROCHLOROTHIAZIDE 20-25 MG PO TABS: 1.0000 | ORAL_TABLET | Freq: Every day | ORAL | Status: DC

## 2014-06-06 MED ORDER — METFORMIN HCL 1000 MG PO TABS: 1000.0000 mg | ORAL_TABLET | Freq: Two times a day (BID) | ORAL | Status: DC

## 2014-06-06 MED ORDER — CIPROFLOXACIN HCL 500 MG PO TABS: 500.0000 mg | ORAL_TABLET | Freq: Two times a day (BID) | ORAL | Status: DC

## 2014-06-06 NOTE — Addendum Note (Signed)
Addended by: Aniceto BossNIMMONS, SYLVIA A on: 06/06/2014 04:32 PM   Modules accepted: Orders

## 2014-06-06 NOTE — Progress Notes (Signed)
Pre visit review using our clinic review tool, if applicable. No additional management support is needed unless otherwise documented below in the visit note. 

## 2014-06-06 NOTE — Progress Notes (Signed)
   Subjective:    Patient ID: Brittany [name], female    DOB: 08/29/1965, 49 y.o.   MRN: [id number]  HPI Here for 3 days of urinary frequency and burning, sometimes with some pink in the urine. No fever or back pain. She drinks lots of water.    Review of Systems  Constitutional: Negative.   Genitourinary: Positive for dysuria, urgency and frequency. Negative for flank pain and pelvic pain.       Objective:   Physical Exam  Constitutional: She appears well-developed and well-nourished.  Abdominal: Soft. Bowel sounds are normal. She exhibits no distension and no mass. There is no tenderness. There is no rebound and no guarding.          Assessment & Plan:  Add AZO prn

## 2014-06-09 LAB — URINE CULTURE

## 2014-06-20 ENCOUNTER — Other Ambulatory Visit: Payer: Self-pay | Admitting: Family Medicine

## 2014-06-25 ENCOUNTER — Other Ambulatory Visit: Payer: Self-pay | Admitting: Family Medicine

## 2014-06-25 DIAGNOSIS — Z1231 Encounter for screening mammogram for malignant neoplasm of breast: Secondary | ICD-10-CM

## 2014-06-26 ENCOUNTER — Ambulatory Visit (HOSPITAL_COMMUNITY)
Admission: RE | Admit: 2014-06-26 | Discharge: 2014-06-26 | Disposition: A | Discharge status: Home / Self Care | Payer: 59 | Source: Ambulatory Visit | Attending: Family Medicine | Admitting: Family Medicine

## 2014-06-26 DIAGNOSIS — Z1231 Encounter for screening mammogram for malignant neoplasm of breast: Secondary | ICD-10-CM | POA: Diagnosis present

## 2014-09-12 ENCOUNTER — Encounter: Payer: Self-pay | Admitting: Family Medicine

## 2014-09-12 ENCOUNTER — Ambulatory Visit (INDEPENDENT_AMBULATORY_CARE_PROVIDER_SITE_OTHER): Payer: 59 | Admitting: Family Medicine

## 2014-09-12 VITALS — BP 120/85 | HR 96 | Temp 98.4°F | Ht 68.0 in | Wt 196.0 lb

## 2014-09-12 DIAGNOSIS — N390 Urinary tract infection, site not specified: Secondary | ICD-10-CM | POA: Diagnosis not present

## 2014-09-12 DIAGNOSIS — R208 Other disturbances of skin sensation: Secondary | ICD-10-CM | POA: Diagnosis not present

## 2014-09-12 LAB — POCT URINALYSIS DIPSTICK
Bilirubin, UA: NEGATIVE
Blood, UA: NEGATIVE
GLUCOSE UA: 2.5
Ketones, UA: 40
LEUKOCYTES UA: NEGATIVE
Nitrite, UA: NEGATIVE
UROBILINOGEN UA: 0.2
pH, UA: 6

## 2014-09-12 MED ORDER — CIPROFLOXACIN HCL 500 MG PO TABS: 500.0000 mg | ORAL_TABLET | Freq: Two times a day (BID) | ORAL | Status: DC

## 2014-09-12 NOTE — Progress Notes (Signed)
Pre visit review using our clinic review tool, if applicable. No additional management support is needed unless otherwise documented below in the visit note. 

## 2014-09-12 NOTE — Progress Notes (Signed)
   Subjective:    Patient ID: Brittany [name], female    DOB: 07/13/1965, 49 y.o.   MRN: [id number]  HPI Here for 3 days of urinary urgency and burning. No fever.    Review of Systems  Constitutional: Negative.   Genitourinary: Positive for dysuria, urgency and frequency. Negative for hematuria, flank pain and pelvic pain.       Objective:   Physical Exam  Constitutional: She appears well-developed and well-nourished.  Abdominal: Soft. Bowel sounds are normal. She exhibits no distension and no mass. There is no tenderness. There is no rebound and no guarding.          Assessment & Plan:  Frances Furbishrest the UTI with Cipro. Culture the sample.

## 2014-09-14 LAB — URINE CULTURE
COLONY COUNT: NO GROWTH
Organism ID, Bacteria: NO GROWTH

## 2014-10-25 ENCOUNTER — Other Ambulatory Visit: Payer: Self-pay | Admitting: Family Medicine

## 2014-11-17 ENCOUNTER — Other Ambulatory Visit: Payer: Self-pay

## 2014-11-18 ENCOUNTER — Other Ambulatory Visit: Payer: Self-pay | Admitting: Family Medicine

## 2014-11-19 ENCOUNTER — Other Ambulatory Visit: Payer: Self-pay

## 2014-12-05 ENCOUNTER — Telehealth: Payer: Self-pay | Admitting: Family Medicine

## 2014-12-05 NOTE — Telephone Encounter (Signed)
Pt would like a morning cpe (august is avail) however, would also like to bring in her daughter at the same time for a cpe. She needs well child prior to starting high school .8/22. Is it ok to work them in back to back?

## 2014-12-05 NOTE — Telephone Encounter (Signed)
Pt has been scheduled.  °

## 2014-12-05 NOTE — Telephone Encounter (Signed)
Yes that is okay  

## 2014-12-24 ENCOUNTER — Other Ambulatory Visit: Payer: Self-pay | Admitting: Family Medicine

## 2014-12-26 NOTE — Telephone Encounter (Signed)
Patient called for a RX refill on Glipizide to be sent to the [facility] on [address].

## 2014-12-26 NOTE — Telephone Encounter (Signed)
I called and left a voice message for pt to schedule lab appointment, orders are in computer. I did send in script for a 1 month supply e-scribe.

## 2014-12-29 ENCOUNTER — Other Ambulatory Visit (INDEPENDENT_AMBULATORY_CARE_PROVIDER_SITE_OTHER): Payer: 59

## 2014-12-29 DIAGNOSIS — Z0000 Encounter for general adult medical examination without abnormal findings: Secondary | ICD-10-CM

## 2014-12-29 LAB — BASIC METABOLIC PANEL
BUN: 12 mg/dL (ref 6–23)
CO2: 29 mEq/L (ref 19–32)
CREATININE: 0.83 mg/dL (ref 0.40–1.20)
Calcium: 9.8 mg/dL (ref 8.4–10.5)
Chloride: 99 mEq/L (ref 96–112)
GFR: 93.3 mL/min (ref >60.00)
Glucose, Bld: 248 mg/dL — ABNORMAL HIGH (ref 70–99)
POTASSIUM: 4.2 meq/L (ref 3.5–5.1)
Sodium: 136 mEq/L (ref 135–145)

## 2014-12-29 LAB — CBC WITH DIFFERENTIAL/PLATELET
BASOS PCT: 0.5 % (ref 0.0–3.0)
Basophils Absolute: 0 10*3/uL (ref 0.0–0.1)
Eosinophils Absolute: 0.1 10*3/uL (ref 0.0–0.7)
Eosinophils Relative: 1.6 % (ref 0.0–5.0)
HEMATOCRIT: 36.2 % (ref 36.0–46.0)
HEMOGLOBIN: 12 g/dL (ref 12.0–15.0)
LYMPHS ABS: 2 10*3/uL (ref 0.7–4.0)
Lymphocytes Relative: 47.6 % — ABNORMAL HIGH (ref 12.0–46.0)
MCHC: 33.2 g/dL (ref 30.0–36.0)
MCV: 87.8 fl (ref 78.0–100.0)
MONO ABS: 0.3 10*3/uL (ref 0.1–1.0)
MONOS PCT: 7.9 % (ref 3.0–12.0)
NEUTROS PCT: 42.4 % — AB (ref 43.0–77.0)
Neutro Abs: 1.8 10*3/uL (ref 1.4–7.7)
Platelets: 209 10*3/uL (ref 150.0–400.0)
RBC: 4.12 Mil/uL (ref 3.87–5.11)
RDW: 15.2 % (ref 11.5–15.5)
WBC: 4.2 10*3/uL (ref 4.0–10.5)

## 2014-12-29 LAB — LIPID PANEL
Cholesterol: 177 mg/dL (ref 0–200)
HDL: 43.6 mg/dL (ref >39.00)
LDL Cholesterol: 115 mg/dL — ABNORMAL HIGH (ref 0–99)
NONHDL: 133.75
Total CHOL/HDL Ratio: 4
Triglycerides: 94 mg/dL (ref 0.0–149.0)
VLDL: 18.8 mg/dL (ref 0.0–40.0)

## 2014-12-29 LAB — MICROALBUMIN / CREATININE URINE RATIO
Creatinine,U: 173.1 mg/dL
Microalb Creat Ratio: 0.5 mg/g (ref 0.0–30.0)
Microalb, Ur: 0.8 mg/dL (ref 0.0–1.9)

## 2014-12-29 LAB — HEPATIC FUNCTION PANEL
ALK PHOS: 78 U/L (ref 39–117)
ALT: 51 U/L — ABNORMAL HIGH (ref 0–35)
AST: 32 U/L (ref 0–37)
Albumin: 4.1 g/dL (ref 3.5–5.2)
Bilirubin, Direct: 0.1 mg/dL (ref 0.0–0.3)
Total Bilirubin: 0.5 mg/dL (ref 0.2–1.2)
Total Protein: 7 g/dL (ref 6.0–8.3)

## 2014-12-29 LAB — POCT URINALYSIS DIPSTICK
Bilirubin, UA: NEGATIVE
Blood, UA: NEGATIVE
Glucose, UA: NEGATIVE
Ketones, UA: NEGATIVE
Leukocytes, UA: NEGATIVE
NITRITE UA: NEGATIVE
PROTEIN UA: NEGATIVE
Spec Grav, UA: 1.025
Urobilinogen, UA: 0.2
pH, UA: 5.5

## 2014-12-29 LAB — HEMOGLOBIN A1C: Hgb A1c MFr Bld: 8.2 % — ABNORMAL HIGH (ref 4.6–6.5)

## 2014-12-29 LAB — TSH: TSH: 3.85 u[IU]/mL (ref 0.35–4.50)

## 2015-01-07 ENCOUNTER — Ambulatory Visit (INDEPENDENT_AMBULATORY_CARE_PROVIDER_SITE_OTHER): Payer: 59 | Admitting: Family Medicine

## 2015-01-07 ENCOUNTER — Encounter: Payer: Self-pay | Admitting: Family Medicine

## 2015-01-07 VITALS — BP 104/78 | HR 86 | Temp 98.5°F | Ht 68.0 in | Wt 182.0 lb

## 2015-01-07 DIAGNOSIS — Z0000 Encounter for general adult medical examination without abnormal findings: Secondary | ICD-10-CM | POA: Diagnosis not present

## 2015-01-07 MED ORDER — METFORMIN HCL 1000 MG PO TABS: 1000.0000 mg | ORAL_TABLET | Freq: Two times a day (BID) | ORAL | Status: DC

## 2015-01-07 MED ORDER — LISINOPRIL-HYDROCHLOROTHIAZIDE 20-25 MG PO TABS: 1.0000 | ORAL_TABLET | Freq: Every day | ORAL | Status: DC

## 2015-01-07 MED ORDER — ATORVASTATIN CALCIUM 80 MG PO TABS: 80.0000 mg | ORAL_TABLET | Freq: Every day | ORAL | Status: DC

## 2015-01-07 MED ORDER — GLIPIZIDE 10 MG PO TABS: ORAL_TABLET | ORAL | Status: DC

## 2015-01-07 MED ORDER — SITAGLIPTIN PHOSPHATE 100 MG PO TABS: 100.0000 mg | ORAL_TABLET | Freq: Every day | ORAL | Status: DC

## 2015-01-07 NOTE — Progress Notes (Signed)
   Subjective:    Patient ID: Brittany [name], female    DOB: 15-Nov-1965, 49 y.o.   MRN: [id number]  HPI 49 yr old female for a cpx. She feels well and has no concerns. She watches her diet and walks for exercise but her glucoses remain a bit high. Her A1c from last week was 8.2, about the same as last year.    Review of Systems  Constitutional: Negative.   HENT: Negative.   Eyes: Negative.   Respiratory: Negative.   Cardiovascular: Negative.   Gastrointestinal: Negative.   Genitourinary: Negative for dysuria, urgency, frequency, hematuria, flank pain, decreased urine volume, enuresis, difficulty urinating, pelvic pain and dyspareunia.  Musculoskeletal: Negative.   Skin: Negative.   Neurological: Negative.   Psychiatric/Behavioral: Negative.        Objective:   Physical Exam  Constitutional: She is oriented to person, place, and time. She appears well-developed and well-nourished. No distress.  HENT:  Head: Normocephalic and atraumatic.  Right Ear: External ear normal.  Left Ear: External ear normal.  Nose: Nose normal.  Mouth/Throat: Oropharynx is clear and moist. No oropharyngeal exudate.  Eyes: Conjunctivae and EOM are normal. Pupils are equal, round, and reactive to light. No scleral icterus.  Neck: Normal range of motion. Neck supple. No JVD present. No thyromegaly present.  Cardiovascular: Normal rate, regular rhythm, normal heart sounds and intact distal pulses.  Exam reveals no gallop and no friction rub.   No murmur heard. Pulmonary/Chest: Effort normal and breath sounds normal. No respiratory distress. She has no wheezes. She has no rales. She exhibits no tenderness.  Abdominal: Soft. Bowel sounds are normal. She exhibits no distension and no mass. There is no tenderness. There is no rebound and no guarding.  Musculoskeletal: Normal range of motion. She exhibits no edema or tenderness.  Lymphadenopathy:    She has no cervical adenopathy.  Neurological: She is  alert and oriented to person, place, and time. She has normal reflexes. No cranial nerve deficit. She exhibits normal muscle tone. Coordination normal.  Skin: Skin is warm and dry. No rash noted. No erythema.  Psychiatric: She has a normal mood and affect. Her behavior is normal. Judgment and thought content normal.          Assessment & Plan:  Well exam. We reviewed diet and exercise advice. Start on Januvia 100 mg daily and recheck an A1c in 90 days. Set up a colonoscopy.

## 2015-01-07 NOTE — Progress Notes (Signed)
Pre visit review using our clinic review tool, if applicable. No additional management support is needed unless otherwise documented below in the visit note. 

## 2015-01-09 ENCOUNTER — Other Ambulatory Visit: Payer: Self-pay | Admitting: Family Medicine

## 2015-01-09 MED ORDER — SITAGLIPTIN PHOSPHATE 100 MG PO TABS: 100.0000 mg | ORAL_TABLET | Freq: Every day | ORAL | Status: DC

## 2015-01-09 NOTE — Telephone Encounter (Signed)
Pt was put on sitaGLIPtin (JANUVIA) 100 MG tablet on 8/18. But it was sent to [facility]. Pt would like to begin the med right away  And would like  30 day to  [facility]/ [address]

## 2015-01-28 ENCOUNTER — Telehealth: Payer: Self-pay | Admitting: Family Medicine

## 2015-01-28 NOTE — Telephone Encounter (Signed)
Spoke with patient. Patient states she is asymptomatic but when checking glucose, it ranges from 39-117. Fluctuation in glucose is concern related to three diabetic medications she is on: Glipizide, Metformin, Januvia. Advised patient to schedule appointment to follow-up on medications if MD Clent Ridges would like to change any of them. In addition, have patient start keeping a log of her glucose readings and bring to her appointment. Educated patient if start having symptoms of hypoglycemia such as shakes, nervousness, sweating, chills, nausea, rapid heartrate, go to ED. Patient verbalized understanding. Asked patient to be seen this week but patient preference was the 19th for office appointment.

## 2015-01-28 NOTE — Telephone Encounter (Signed)
Pt left a voice message stating that she wants to discuss diabetic medications, has had some low glucose readings 39, 65. She really needs to do a triage call.

## 2015-02-03 ENCOUNTER — Telehealth: Payer: Self-pay | Admitting: Family Medicine

## 2015-02-03 NOTE — Telephone Encounter (Signed)
Received a fax from [facility].  Januvia is not covered by her plan.  Please change medication or send to [facility] for prior authorization.  Thanks!

## 2015-02-05 ENCOUNTER — Telehealth: Payer: Self-pay | Admitting: Family Medicine

## 2015-02-05 NOTE — Telephone Encounter (Signed)
Pt said insurance will cover the  following     tradjenta  onglica  nesina

## 2015-02-05 NOTE — Telephone Encounter (Signed)
I spoke with pt and she will contact insurance company and find out what else they will cover, she will call back with this information.

## 2015-02-06 MED ORDER — SAXAGLIPTIN HCL 5 MG PO TABS: 5.0000 mg | ORAL_TABLET | Freq: Every day | ORAL | Status: DC

## 2015-02-06 NOTE — Telephone Encounter (Signed)
Cancel Januvia, instead call in Onglyza 5 mg daily, #30 with 11 rf.

## 2015-02-06 NOTE — Addendum Note (Signed)
Addended by: Aniceto Boss A on: 02/06/2015 04:25 PM   Modules accepted: Orders

## 2015-02-06 NOTE — Telephone Encounter (Signed)
I spoke with pt and sent script e-scribe to [facility].

## 2015-02-06 NOTE — Addendum Note (Signed)
Addended by: Gershon Crane A on: 02/06/2015 02:37 PM   Modules accepted: Orders, Medications

## 2015-02-09 ENCOUNTER — Encounter: Payer: Self-pay | Admitting: Family Medicine

## 2015-02-09 ENCOUNTER — Ambulatory Visit (INDEPENDENT_AMBULATORY_CARE_PROVIDER_SITE_OTHER): Payer: 59 | Admitting: Family Medicine

## 2015-02-09 ENCOUNTER — Ambulatory Visit: Payer: 59 | Admitting: Family Medicine

## 2015-02-09 VITALS — BP 118/75 | HR 86 | Temp 98.3°F | Ht 68.0 in | Wt 191.0 lb

## 2015-02-09 DIAGNOSIS — J019 Acute sinusitis, unspecified: Secondary | ICD-10-CM | POA: Diagnosis not present

## 2015-02-09 MED ORDER — FLUCONAZOLE 150 MG PO TABS: 150.0000 mg | ORAL_TABLET | Freq: Every day | ORAL | Status: DC

## 2015-02-09 MED ORDER — AMOXICILLIN-POT CLAVULANATE 875-125 MG PO TABS: 1.0000 | ORAL_TABLET | Freq: Two times a day (BID) | ORAL | Status: DC

## 2015-02-09 MED ORDER — METHYLPREDNISOLONE ACETATE 80 MG/ML IJ SUSP
120.0000 mg | Freq: Once | INTRAMUSCULAR | Status: AC
  Administered 2015-02-09: 120 mg via INTRAMUSCULAR

## 2015-02-09 NOTE — Addendum Note (Signed)
Addended by: Aniceto Boss A on: 02/09/2015 03:37 PM   Modules accepted: Orders

## 2015-02-09 NOTE — Progress Notes (Signed)
   Subjective:    Patient ID: Brittany [name], female    DOB: [birth year], 49 y.o.   MRN: [id number]  HPI Here for 5 days of sinus pressure, HA, PND, ST, and a dry cough. Using Claritin and Nyquil.    Review of Systems  Constitutional: Negative.   HENT: Positive for congestion, postnasal drip and sinus pressure.   Eyes: Negative.   Respiratory: Positive for cough.        Objective:   Physical Exam  Constitutional: She appears well-developed and well-nourished.  HENT:  Right Ear: External ear normal.  Left Ear: External ear normal.  Nose: Nose normal.  Mouth/Throat: Oropharynx is clear and moist.  Eyes: Conjunctivae are normal.  Cardiovascular: Normal rate, regular rhythm, normal heart sounds and intact distal pulses.   Pulmonary/Chest: Effort normal and breath sounds normal.  Lymphadenopathy:    She has no cervical adenopathy.          Assessment & Plan:  Sinusitis, treat with Augmentin and a steroid shot

## 2015-02-09 NOTE — Progress Notes (Signed)
Pre visit review using our clinic review tool, if applicable. No additional management support is needed unless otherwise documented below in the visit note. 

## 2015-02-10 ENCOUNTER — Telehealth: Payer: Self-pay | Admitting: Family Medicine

## 2015-02-10 NOTE — Telephone Encounter (Signed)
Script was changed, see previous note.

## 2015-02-10 NOTE — Telephone Encounter (Signed)
Prior Authorization for Brittany [name] was denied. Patient must try and fail (over a 3 month span) both [place] and [place].

## 2015-05-06 ENCOUNTER — Telehealth: Payer: Self-pay | Admitting: Family Medicine

## 2015-05-06 DIAGNOSIS — E119 Type 2 diabetes mellitus without complications: Secondary | ICD-10-CM

## 2015-05-06 DIAGNOSIS — E785 Hyperlipidemia, unspecified: Secondary | ICD-10-CM

## 2015-05-06 NOTE — Telephone Encounter (Signed)
The orders are in, she can now schedule these

## 2015-05-06 NOTE — Telephone Encounter (Signed)
Pt is dm and on chole med. Pt would liike to have fasting lab to check lipid and ?A1C . Can I sch?

## 2015-05-07 NOTE — Telephone Encounter (Signed)
Pt has been sch for Monday 05-11-15

## 2015-05-11 ENCOUNTER — Other Ambulatory Visit (INDEPENDENT_AMBULATORY_CARE_PROVIDER_SITE_OTHER): Payer: 59

## 2015-05-11 DIAGNOSIS — E119 Type 2 diabetes mellitus without complications: Secondary | ICD-10-CM | POA: Diagnosis not present

## 2015-05-11 DIAGNOSIS — E785 Hyperlipidemia, unspecified: Secondary | ICD-10-CM | POA: Diagnosis not present

## 2015-05-11 LAB — HEPATIC FUNCTION PANEL
ALK PHOS: 77 U/L (ref 39–117)
ALT: 25 U/L (ref 0–35)
AST: 15 U/L (ref 0–37)
Albumin: 4.3 g/dL (ref 3.5–5.2)
BILIRUBIN DIRECT: 0.1 mg/dL (ref 0.0–0.3)
TOTAL PROTEIN: 7.1 g/dL (ref 6.0–8.3)
Total Bilirubin: 0.5 mg/dL (ref 0.2–1.2)

## 2015-05-11 LAB — LIPID PANEL
CHOL/HDL RATIO: 4
Cholesterol: 200 mg/dL (ref 0–200)
HDL: 54.8 mg/dL (ref >39.00)
LDL Cholesterol: 129 mg/dL — ABNORMAL HIGH (ref 0–99)
NONHDL: 145.36
Triglycerides: 81 mg/dL (ref 0.0–149.0)
VLDL: 16.2 mg/dL (ref 0.0–40.0)

## 2015-05-11 LAB — HEMOGLOBIN A1C: Hgb A1c MFr Bld: 6.9 % — ABNORMAL HIGH (ref 4.6–6.5)

## 2015-05-20 ENCOUNTER — Telehealth: Payer: Self-pay | Admitting: Family Medicine

## 2015-05-20 NOTE — Telephone Encounter (Signed)
Pt would like blood work results °

## 2015-05-20 NOTE — Telephone Encounter (Signed)
Attempted to contact patient with lab results - patient answered and stated she was in a meeting & will call back later.

## 2015-05-20 NOTE — Telephone Encounter (Signed)
Pt has not been taking Glipizide, she takes 2 medications for diabetes, however does not tolerate the Glipizide.

## 2015-05-20 NOTE — Telephone Encounter (Signed)
I spoke with pt and went over results. 

## 2015-05-21 NOTE — Telephone Encounter (Signed)
noted 

## 2015-06-08 ENCOUNTER — Ambulatory Visit (INDEPENDENT_AMBULATORY_CARE_PROVIDER_SITE_OTHER): Payer: 59 | Admitting: Family Medicine

## 2015-06-08 ENCOUNTER — Encounter: Payer: Self-pay | Admitting: Family Medicine

## 2015-06-08 VITALS — BP 110/81 | HR 101 | Temp 97.9°F | Ht 68.0 in | Wt 181.0 lb

## 2015-06-08 DIAGNOSIS — N390 Urinary tract infection, site not specified: Secondary | ICD-10-CM | POA: Diagnosis not present

## 2015-06-08 DIAGNOSIS — R8290 Unspecified abnormal findings in urine: Secondary | ICD-10-CM

## 2015-06-08 LAB — POCT URINALYSIS DIPSTICK
Bilirubin, UA: NEGATIVE
Blood, UA: NEGATIVE
GLUCOSE UA: NEGATIVE
KETONES UA: NEGATIVE
LEUKOCYTES UA: NEGATIVE
Nitrite, UA: NEGATIVE
PROTEIN UA: NEGATIVE
Urobilinogen, UA: 0.2
pH, UA: 5.5

## 2015-06-08 MED ORDER — LORAZEPAM 1 MG PO TABS: 1.0000 mg | ORAL_TABLET | Freq: Two times a day (BID) | ORAL | Status: DC | PRN

## 2015-06-08 NOTE — Progress Notes (Signed)
Pre visit review using our clinic review tool, if applicable. No additional management support is needed unless otherwise documented below in the visit note. 

## 2015-06-08 NOTE — Progress Notes (Signed)
   Subjective:    Patient ID: Brittany [name], female    DOB: 10/23/1965, 50 y.o.   MRN: [id number]  HPI Here to follow up an Urgent Care visit on 06-02-15 for a UTI. She presented with urinary urgency, burning and blood in the urine. No nausea or fever. She was given 7 days of Levaquin, and she has one day of this left. She feels much better. No more blood in the urine and the burning has stopped. She still has some slight urgency. Her original sample was sent for a culture by the Urgent Care and she had a telephone report that said "no growth". She is drinking lots of water. A UA here today is totally clear.    Review of Systems  Constitutional: Negative.   Respiratory: Negative.   Cardiovascular: Negative.   Gastrointestinal: Negative.   Genitourinary: Positive for urgency. Negative for dysuria, frequency, hematuria, flank pain, vaginal bleeding, vaginal discharge and difficulty urinating.       Objective:   Physical Exam  Constitutional: She appears well-developed and well-nourished.  Cardiovascular: Normal rate, regular rhythm, normal heart sounds and intact distal pulses.   Pulmonary/Chest: Effort normal and breath sounds normal.  Abdominal: Soft. Bowel sounds are normal. She exhibits no distension and no mass. There is no tenderness. There is no rebound and no guarding.  No CVAT          Assessment & Plan:  UTI, which seems to be under control. She will finish up the Levaquin. We will repeat a UA in one week.

## 2015-06-09 ENCOUNTER — Ambulatory Visit: Payer: 59 | Admitting: Family Medicine

## 2015-06-10 ENCOUNTER — Ambulatory Visit: Payer: 59 | Admitting: Family Medicine

## 2015-06-10 LAB — URINE CULTURE
COLONY COUNT: NO GROWTH
ORGANISM ID, BACTERIA: NO GROWTH

## 2015-08-06 ENCOUNTER — Telehealth: Payer: Self-pay | Admitting: Family Medicine

## 2015-08-06 ENCOUNTER — Other Ambulatory Visit: Payer: Self-pay

## 2015-08-06 MED ORDER — SAXAGLIPTIN HCL 5 MG PO TABS: 5.0000 mg | ORAL_TABLET | Freq: Every day | ORAL | Status: DC

## 2015-08-06 NOTE — Telephone Encounter (Signed)
Rx sent 

## 2015-08-06 NOTE — Telephone Encounter (Signed)
Pt request refill  saxagliptin HCl (ONGLYZA) 5 MG TABS tablet  [facility] [address]

## 2015-10-01 ENCOUNTER — Other Ambulatory Visit: Payer: Self-pay | Admitting: Family Medicine

## 2015-10-06 ENCOUNTER — Other Ambulatory Visit: Payer: Self-pay | Admitting: *Deleted

## 2015-10-06 MED ORDER — LORAZEPAM 1 MG PO TABS: 1.0000 mg | ORAL_TABLET | Freq: Two times a day (BID) | ORAL | Status: DC | PRN

## 2015-10-06 NOTE — Telephone Encounter (Signed)
Faxed refill request from [facility] for Lorazepam

## 2015-10-06 NOTE — Telephone Encounter (Signed)
Call in #[phone] with one rf  

## 2015-10-09 ENCOUNTER — Ambulatory Visit (INDEPENDENT_AMBULATORY_CARE_PROVIDER_SITE_OTHER): Payer: 59 | Admitting: Family Medicine

## 2015-10-09 ENCOUNTER — Encounter: Payer: Self-pay | Admitting: Family Medicine

## 2015-10-09 VITALS — BP 101/73 | HR 86 | Temp 98.2°F | Ht 68.0 in | Wt 175.0 lb

## 2015-10-09 DIAGNOSIS — E119 Type 2 diabetes mellitus without complications: Secondary | ICD-10-CM

## 2015-10-09 DIAGNOSIS — K219 Gastro-esophageal reflux disease without esophagitis: Secondary | ICD-10-CM

## 2015-10-09 LAB — HEMOGLOBIN A1C: HEMOGLOBIN A1C: 6.7 % — AB (ref 4.6–6.5)

## 2015-10-09 MED ORDER — OMEPRAZOLE 40 MG PO CPDR: 40.0000 mg | DELAYED_RELEASE_CAPSULE | Freq: Every day | ORAL | Status: DC

## 2015-10-09 NOTE — Progress Notes (Signed)
Pre visit review using our clinic review tool, if applicable. No additional management support is needed unless otherwise documented below in the visit note. 

## 2015-10-09 NOTE — Progress Notes (Signed)
   Subjective:    Patient ID: Brittany [name], female    DOB: 09/06/1965, 50 y.o.   MRN: [id number]  HPI Here for 2 months of frequent heartburn and indigestion. She also describes a frequent dull aching pain in the upper abdomen. She has occasional nausea but has not vomited. No fever. BMs are normal. She describes being under stress lately with family issues. She gets partial relied with Zantac.    Review of Systems  Constitutional: Negative.   Respiratory: Negative.   Cardiovascular: Negative.   Gastrointestinal: Positive for nausea and abdominal pain. Negative for vomiting, diarrhea, constipation, blood in stool, abdominal distention, anal bleeding and rectal pain.       Objective:   Physical Exam  Constitutional: She appears well-developed and well-nourished.  Neck: No thyromegaly present.  Cardiovascular: Normal rate, regular rhythm, normal heart sounds and intact distal pulses.   Pulmonary/Chest: Effort normal and breath sounds normal.  Abdominal: Soft. Bowel sounds are normal. She exhibits no distension and no mass. There is no tenderness. There is no rebound and no guarding.  Lymphadenopathy:    She has no cervical adenopathy.          Assessment & Plan:  She has GERD and probably some gastritis. Try Omeprazole 40 mg every day. Recheck prn.  Nelwyn SalisburyFRY,Zalyn Amend A, MD

## 2015-12-05 ENCOUNTER — Other Ambulatory Visit: Payer: Self-pay | Admitting: Family Medicine

## 2015-12-11 ENCOUNTER — Encounter: Payer: Self-pay | Admitting: Family Medicine

## 2015-12-19 IMAGING — CR DG CHEST 2V
2 series · 2 of 2 positions shown · non-contrast
Comparison: None.

CLINICAL DATA: Chest pain, cough

EXAM:
CHEST  2 VIEW

[w chest pa]
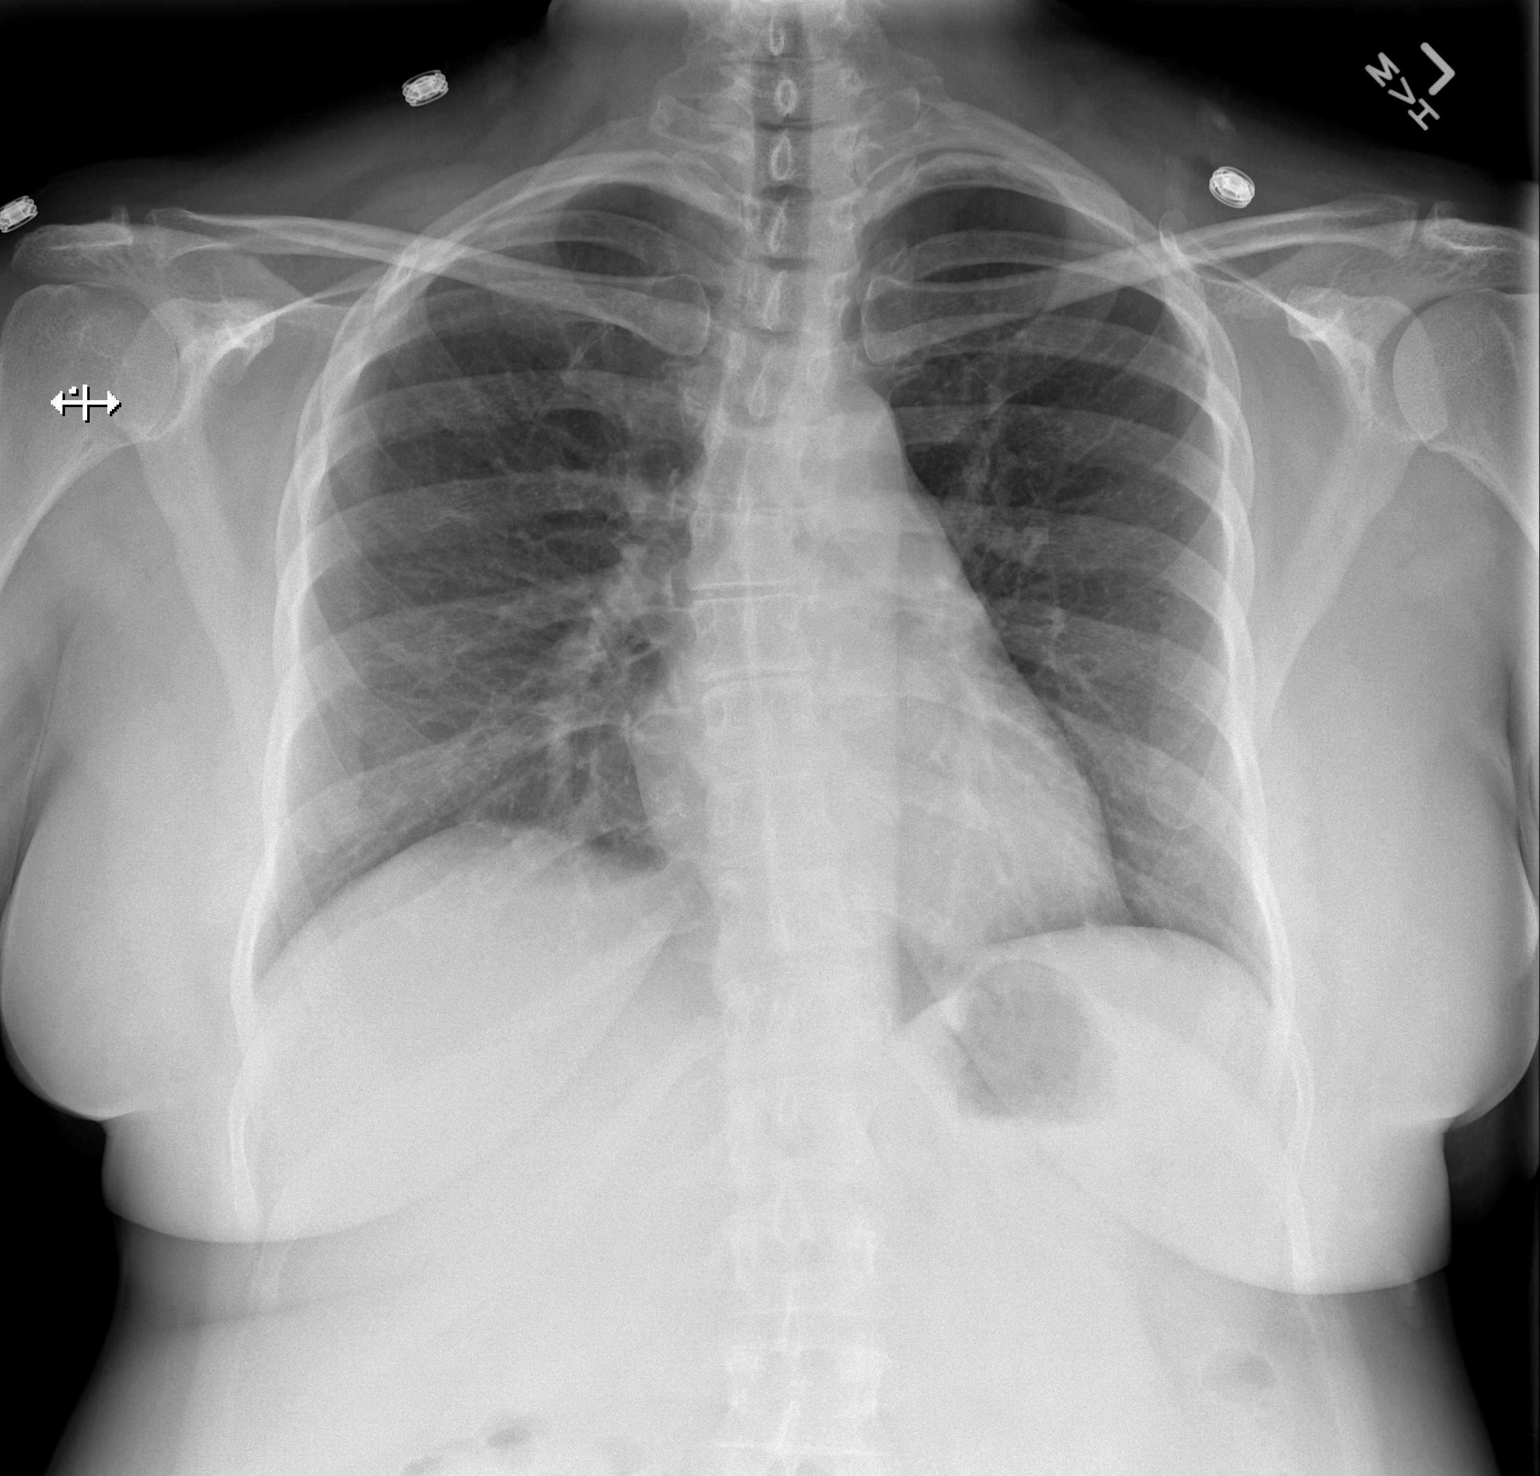

[w chest lat]
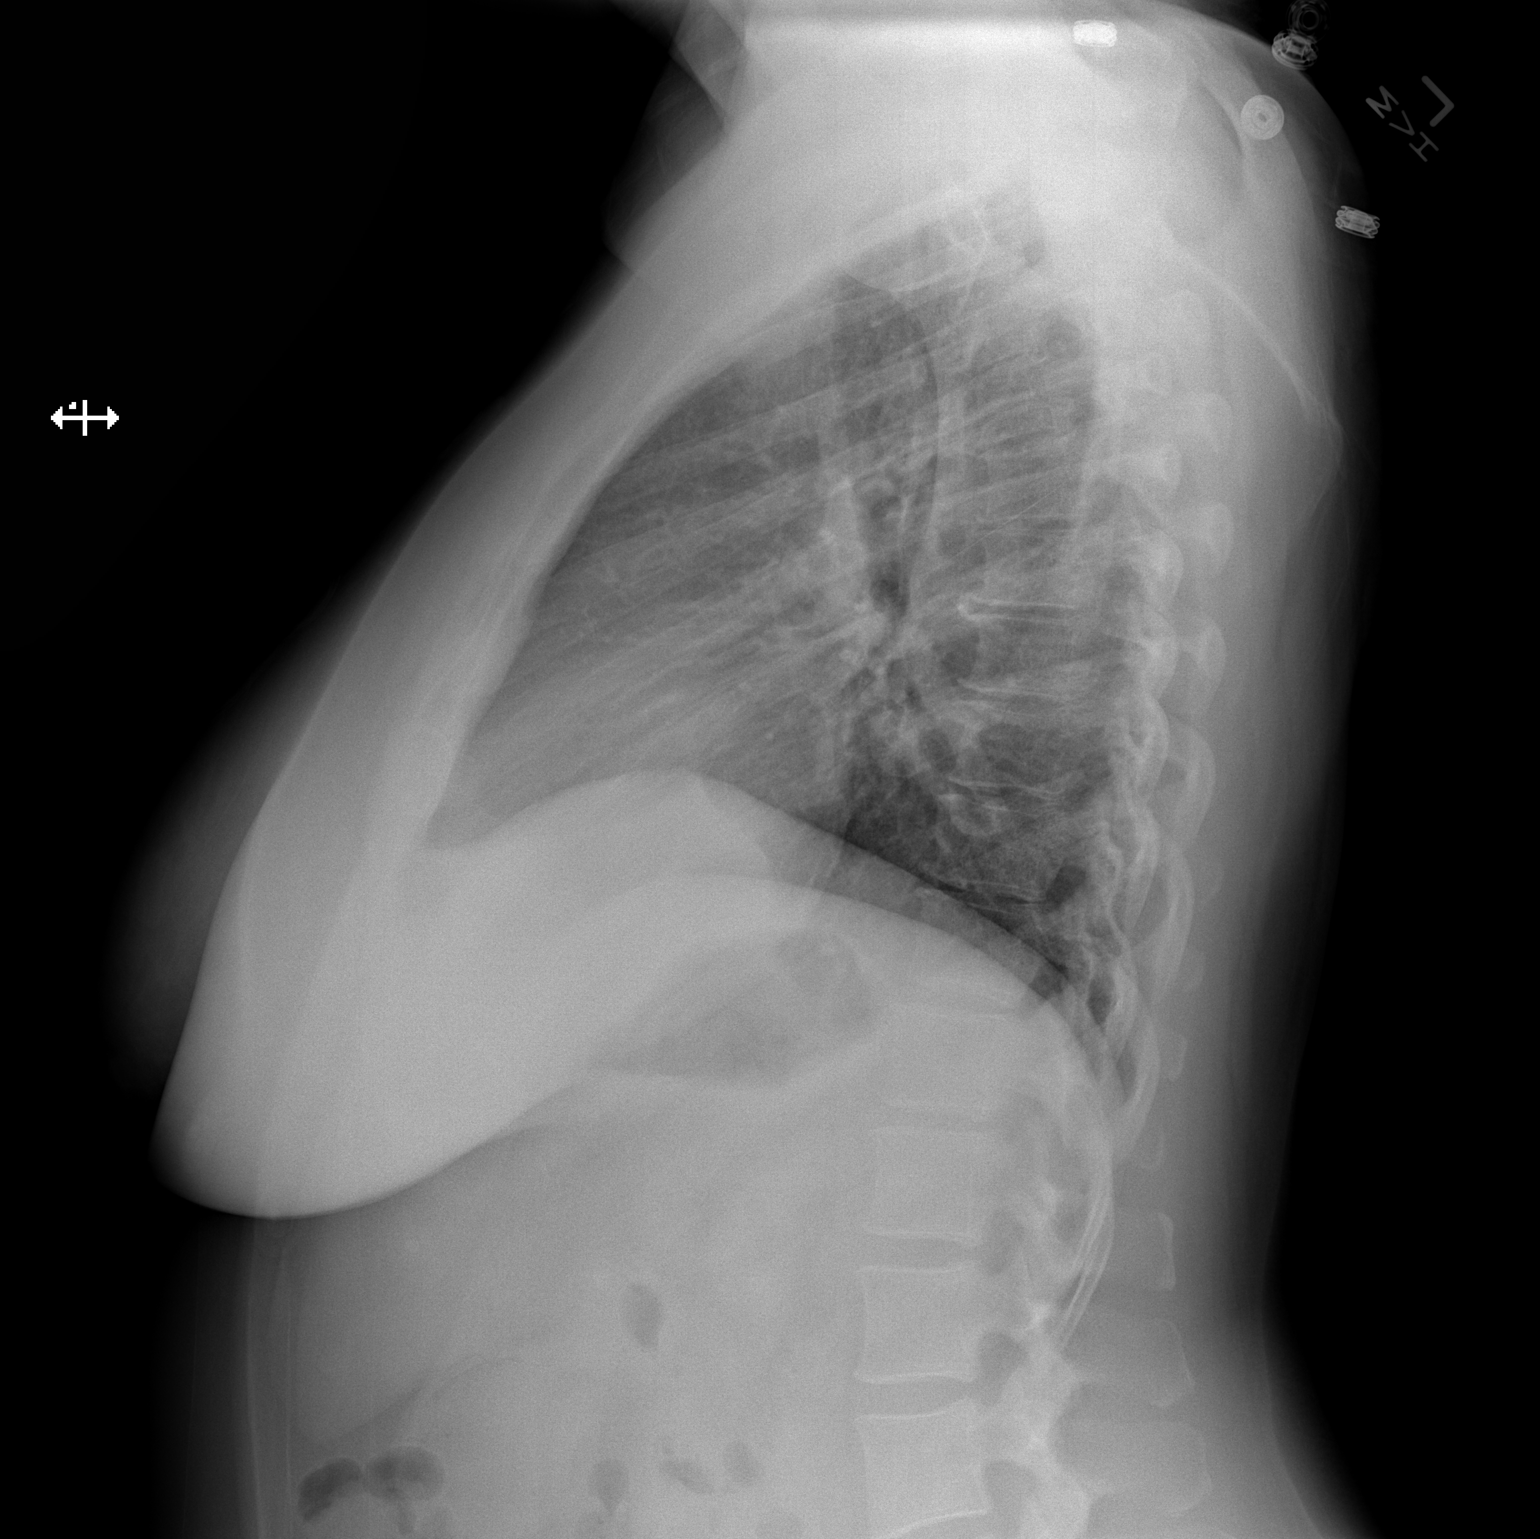

[2 of 2 positions shown; findings below may reference images not displayed]

FINDINGS: Lungs are clear.  No pleural effusion or pneumothorax.

The heart is normal in size.

Mild degenerative changes of the visualized thoracolumbar spine.
IMPRESSION: No evidence of acute cardiopulmonary disease.

## 2016-01-06 ENCOUNTER — Other Ambulatory Visit: Payer: Self-pay | Admitting: Family Medicine

## 2016-02-04 ENCOUNTER — Telehealth: Payer: Self-pay | Admitting: Family Medicine

## 2016-02-04 NOTE — Telephone Encounter (Signed)
Refill request for Omeprazole & Atorvastatin, send a [facility] day supply [facility].

## 2016-02-05 NOTE — Telephone Encounter (Signed)
Pt would like for you to resend she has spoke with pharmacy and they have not gotten it.   Can you send  [phone]   [phone] to speak with someone.

## 2016-02-08 MED ORDER — OMEPRAZOLE 40 MG PO CPDR: 40.0000 mg | DELAYED_RELEASE_CAPSULE | Freq: Every day | ORAL | 0 refills | Status: DC

## 2016-02-08 MED ORDER — ATORVASTATIN CALCIUM 80 MG PO TABS: 80.0000 mg | ORAL_TABLET | Freq: Every day | ORAL | 0 refills | Status: DC

## 2016-02-08 NOTE — Telephone Encounter (Signed)
I sent both scripts e-scribe to below pharmacy. 

## 2016-03-03 ENCOUNTER — Encounter: Payer: Self-pay | Admitting: Nurse Practitioner

## 2016-03-15 ENCOUNTER — Ambulatory Visit: Payer: 59 | Admitting: Nurse Practitioner

## 2016-04-01 ENCOUNTER — Other Ambulatory Visit: Payer: Self-pay | Admitting: Family Medicine

## 2016-04-01 MED ORDER — LISINOPRIL-HYDROCHLOROTHIAZIDE 20-25 MG PO TABS: 1.0000 | ORAL_TABLET | Freq: Every day | ORAL | 1 refills | Status: DC

## 2016-04-01 NOTE — Telephone Encounter (Signed)
Refill request for Lisinopril/HCTZ and a [facility] supply [facility].

## 2016-04-01 NOTE — Telephone Encounter (Signed)
I sent script e-scribe to [facility].  

## 2016-04-02 ENCOUNTER — Other Ambulatory Visit: Payer: Self-pay | Admitting: Family Medicine

## 2016-04-22 ENCOUNTER — Encounter: Payer: Self-pay | Admitting: Family Medicine

## 2016-04-22 HISTORY — PX: ESOPHAGOGASTRODUODENOSCOPY: SHX1529

## 2016-04-22 HISTORY — PX: COLONOSCOPY: SHX174

## 2016-06-25 ENCOUNTER — Other Ambulatory Visit: Payer: Self-pay | Admitting: Family Medicine

## 2016-08-03 ENCOUNTER — Other Ambulatory Visit: Payer: Self-pay | Admitting: Family Medicine

## 2016-08-08 ENCOUNTER — Telehealth: Payer: Self-pay | Admitting: Family Medicine

## 2016-08-08 NOTE — Telephone Encounter (Signed)
Script was sent e-scribe to [facility] on 08/04/2016, tried to reach pt and no answer.

## 2016-08-08 NOTE — Telephone Encounter (Signed)
Pt is needing refill ASAP for ONGLYZA 5 MG TABS tablet.  [facility] advised pt that they sent a request last week.  Pt wants the Rx sent to [facility]

## 2016-08-12 ENCOUNTER — Ambulatory Visit (INDEPENDENT_AMBULATORY_CARE_PROVIDER_SITE_OTHER): Payer: 59 | Admitting: Family Medicine

## 2016-08-12 ENCOUNTER — Encounter: Payer: Self-pay | Admitting: Family Medicine

## 2016-08-12 VITALS — BP 102/72 | HR 78 | Temp 97.6°F | Ht 68.0 in | Wt 173.8 lb

## 2016-08-12 DIAGNOSIS — E119 Type 2 diabetes mellitus without complications: Secondary | ICD-10-CM

## 2016-08-12 DIAGNOSIS — L723 Sebaceous cyst: Secondary | ICD-10-CM

## 2016-08-12 DIAGNOSIS — Z0000 Encounter for general adult medical examination without abnormal findings: Secondary | ICD-10-CM

## 2016-08-12 LAB — POC URINALSYSI DIPSTICK (AUTOMATED)
Bilirubin, UA: NEGATIVE
Blood, UA: NEGATIVE
Glucose, UA: NEGATIVE
Ketones, UA: NEGATIVE
Leukocytes, UA: NEGATIVE
Nitrite, UA: NEGATIVE
Protein, UA: NEGATIVE
Spec Grav, UA: 1.015 (ref 1.030–1.035)
Urobilinogen, UA: 0.2 (ref ?–2.0)
pH, UA: 7 (ref 5.0–8.0)

## 2016-08-12 LAB — BASIC METABOLIC PANEL WITH GFR
BUN: 16 mg/dL (ref 6–23)
CO2: 30 meq/L (ref 19–32)
Calcium: 9.4 mg/dL (ref 8.4–10.5)
Chloride: 98 meq/L (ref 96–112)
Creatinine, Ser: 0.92 mg/dL (ref 0.40–1.20)
GFR: 82.32 mL/min (ref >60.00)
Glucose, Bld: 109 mg/dL — ABNORMAL HIGH (ref 70–99)
Potassium: 4 meq/L (ref 3.5–5.1)
Sodium: 136 meq/L (ref 135–145)

## 2016-08-12 LAB — HEPATIC FUNCTION PANEL
ALBUMIN: 4.3 g/dL (ref 3.5–5.2)
ALK PHOS: 83 U/L (ref 39–117)
ALT: 21 U/L (ref 0–35)
AST: 17 U/L (ref 0–37)
Bilirubin, Direct: 0.1 mg/dL (ref 0.0–0.3)
Total Bilirubin: 0.4 mg/dL (ref 0.2–1.2)
Total Protein: 6.7 g/dL (ref 6.0–8.3)

## 2016-08-12 LAB — CBC WITH DIFFERENTIAL/PLATELET
BASOS PCT: 0.6 % (ref 0.0–3.0)
Basophils Absolute: 0 10*3/uL (ref 0.0–0.1)
EOS ABS: 0.1 10*3/uL (ref 0.0–0.7)
Eosinophils Relative: 1.1 % (ref 0.0–5.0)
HCT: 33.3 % — ABNORMAL LOW (ref 36.0–46.0)
Hemoglobin: 11 g/dL — ABNORMAL LOW (ref 12.0–15.0)
Lymphocytes Relative: 30.9 % (ref 12.0–46.0)
Lymphs Abs: 1.5 10*3/uL (ref 0.7–4.0)
MCHC: 33.1 g/dL (ref 30.0–36.0)
MCV: 84.2 fl (ref 78.0–100.0)
MONOS PCT: 7.5 % (ref 3.0–12.0)
Monocytes Absolute: 0.4 10*3/uL (ref 0.1–1.0)
NEUTROS ABS: 3 10*3/uL (ref 1.4–7.7)
Neutrophils Relative %: 59.9 % (ref 43.0–77.0)
PLATELETS: 229 10*3/uL (ref 150.0–400.0)
RBC: 3.96 Mil/uL (ref 3.87–5.11)
RDW: 15.6 % — AB (ref 11.5–15.5)
WBC: 5 10*3/uL (ref 4.0–10.5)

## 2016-08-12 LAB — LIPID PANEL
Cholesterol: 183 mg/dL (ref 0–200)
HDL: 44.2 mg/dL (ref >39.00)
LDL CALC: 123 mg/dL — AB (ref 0–99)
NONHDL: 138.85
Total CHOL/HDL Ratio: 4
Triglycerides: 78 mg/dL (ref 0.0–149.0)
VLDL: 15.6 mg/dL (ref 0.0–40.0)

## 2016-08-12 LAB — HEMOGLOBIN A1C: Hgb A1c MFr Bld: 6.4 % (ref 4.6–6.5)

## 2016-08-12 LAB — TSH: TSH: 2.77 u[IU]/mL (ref 0.35–4.50)

## 2016-08-12 MED ORDER — LORAZEPAM 1 MG PO TABS: 1.0000 mg | ORAL_TABLET | Freq: Two times a day (BID) | ORAL | 1 refills | Status: DC | PRN

## 2016-08-12 MED ORDER — SAXAGLIPTIN HCL 5 MG PO TABS: 5.0000 mg | ORAL_TABLET | Freq: Every day | ORAL | 3 refills | Status: DC

## 2016-08-12 MED ORDER — LISINOPRIL-HYDROCHLOROTHIAZIDE 20-25 MG PO TABS: 1.0000 | ORAL_TABLET | Freq: Every day | ORAL | 3 refills | Status: DC

## 2016-08-12 MED ORDER — ATORVASTATIN CALCIUM 80 MG PO TABS: 80.0000 mg | ORAL_TABLET | Freq: Every day | ORAL | 3 refills | Status: DC

## 2016-08-12 MED ORDER — OMEPRAZOLE 40 MG PO CPDR: 40.0000 mg | DELAYED_RELEASE_CAPSULE | Freq: Every day | ORAL | 3 refills | Status: DC

## 2016-08-12 MED ORDER — METFORMIN HCL 1000 MG PO TABS: ORAL_TABLET | ORAL | 3 refills | Status: DC

## 2016-08-12 NOTE — Progress Notes (Signed)
   Subjective:    Patient ID: Brittany [name], female    DOB: 05/19/1966, 51 y.o.   MRN: [id number]  HPI 51 yr old female for a well exam. She feels well. Her BP is stable. She does not check her glucoses very often.    Review of Systems  Constitutional: Negative.   HENT: Negative.   Eyes: Negative.   Respiratory: Negative.   Cardiovascular: Negative.   Gastrointestinal: Negative.   Genitourinary: Negative for decreased urine volume, difficulty urinating, dyspareunia, dysuria, enuresis, flank pain, frequency, hematuria, pelvic pain and urgency.  Musculoskeletal: Negative.   Skin: Negative.   Neurological: Negative.   Psychiatric/Behavioral: Negative.        Objective:   Physical Exam  Constitutional: She is oriented to person, place, and time. She appears well-developed and well-nourished. No distress.  HENT:  Head: Normocephalic and atraumatic.  Right Ear: External ear normal.  Left Ear: External ear normal.  Nose: Nose normal.  Mouth/Throat: Oropharynx is clear and moist. No oropharyngeal exudate.  Eyes: Conjunctivae and EOM are normal. Pupils are equal, round, and reactive to light. No scleral icterus.  Neck: Normal range of motion. Neck supple. No JVD present. No thyromegaly present.  Cardiovascular: Normal rate, regular rhythm, normal heart sounds and intact distal pulses.  Exam reveals no gallop and no friction rub.   No murmur heard. Pulmonary/Chest: Effort normal and breath sounds normal. No respiratory distress. She has no wheezes. She has no rales. She exhibits no tenderness.  Abdominal: Soft. Bowel sounds are normal. She exhibits no distension and no mass. There is no tenderness. There is no rebound and no guarding.  Musculoskeletal: Normal range of motion. She exhibits no edema or tenderness.  Lymphadenopathy:    She has no cervical adenopathy.  Neurological: She is alert and oriented to person, place, and time. She has normal reflexes. No cranial nerve  deficit. She exhibits normal muscle tone. Coordination normal.  Skin: Skin is warm and dry. No rash noted. No erythema.  Psychiatric: She has a normal mood and affect. Her behavior is normal. Judgment and thought content normal.          Assessment & Plan:  Well exam. We discussed diet and exercise. Get fasting labs today.  Gershon CraneStephen Genaro Bekker, MD

## 2016-08-12 NOTE — Patient Instructions (Signed)
WE NOW OFFER   Brittany [name]'s FAST TRACK!!!  SAME DAY Appointments for ACUTE CARE  Such as: Sprains, Injuries, cuts, abrasions, rashes, muscle pain, joint pain, back pain Colds, flu, sore throats, headache, allergies, cough, fever  Ear pain, sinus and eye infections Abdominal pain, nausea, vomiting, diarrhea, upset stomach Animal/insect bites  3 Easy Ways to Schedule: Walk-In Scheduling Call in scheduling Mychart Sign-up: [email or web address]         

## 2016-08-12 NOTE — Progress Notes (Signed)
Pre visit review using our clinic review tool, if applicable. No additional management support is needed unless otherwise documented below in the visit note. 

## 2016-12-13 ENCOUNTER — Telehealth: Payer: Self-pay | Admitting: Family Medicine

## 2016-12-13 NOTE — Telephone Encounter (Signed)
Pt states the cyst on her head is getting bigger and possibly causing her headaches.  Would like a referral to have this removed if that is what needs to be done. Please advise.

## 2016-12-13 NOTE — Addendum Note (Signed)
Addended by: Gershon CraneFRY, STEPHEN A on: 12/13/2016 12:11 PM   Modules accepted: Orders

## 2016-12-13 NOTE — Telephone Encounter (Signed)
I will go ahead and refer her to Plastic Surgery for this

## 2016-12-19 LAB — HM MAMMOGRAPHY

## 2016-12-21 ENCOUNTER — Encounter: Payer: Self-pay | Admitting: Family Medicine

## 2017-01-18 ENCOUNTER — Telehealth: Payer: Self-pay | Admitting: Family Medicine

## 2017-01-18 MED ORDER — GLUCOSE BLOOD VI STRP: ORAL_STRIP | 1 refills | Status: DC

## 2017-01-18 MED ORDER — LORAZEPAM 1 MG PO TABS: 1.0000 mg | ORAL_TABLET | Freq: Two times a day (BID) | ORAL | 1 refills | Status: DC | PRN

## 2017-01-18 MED ORDER — ONETOUCH DELICA LANCETS FINE MISC: 1 refills | Status: DC

## 2017-01-18 NOTE — Telephone Encounter (Signed)
I sent diabetic testing supplies to [facility], just need other script.

## 2017-01-18 NOTE — Telephone Encounter (Signed)
Refill request for Lorazepam 1 mg take 1 po bid & One Touch Verio stips/one touch delica lancets, send a 90 day supply to [facility].

## 2017-01-18 NOTE — Telephone Encounter (Signed)
done

## 2017-01-18 NOTE — Telephone Encounter (Signed)
I faxed script for Lorazepam to [facility].

## 2017-02-09 ENCOUNTER — Encounter: Payer: Self-pay | Admitting: Family Medicine

## 2017-03-06 NOTE — Telephone Encounter (Signed)
Pt has appt scheduled with [facility] tomorrow afternoon. She states that they are saying they needs her referral and records refaxed.  Gavin Pound - can you resend this information please? Thanks!

## 2017-05-17 ENCOUNTER — Encounter: Payer: Self-pay | Admitting: Family Medicine

## 2017-05-17 ENCOUNTER — Ambulatory Visit (INDEPENDENT_AMBULATORY_CARE_PROVIDER_SITE_OTHER): Payer: 59 | Admitting: Family Medicine

## 2017-05-17 VITALS — BP 120/90 | HR 82 | Temp 98.3°F | Wt 191.0 lb

## 2017-05-17 DIAGNOSIS — J018 Other acute sinusitis: Secondary | ICD-10-CM

## 2017-05-17 MED ORDER — METHYLPREDNISOLONE ACETATE 80 MG/ML IJ SUSP
120.0000 mg | Freq: Once | INTRAMUSCULAR | Status: AC
  Administered 2017-05-17: 120 mg via INTRAMUSCULAR

## 2017-05-17 MED ORDER — AMOXICILLIN-POT CLAVULANATE 875-125 MG PO TABS: 1.0000 | ORAL_TABLET | Freq: Two times a day (BID) | ORAL | 0 refills | Status: DC

## 2017-05-17 MED ORDER — FLUCONAZOLE 150 MG PO TABS: 150.0000 mg | ORAL_TABLET | Freq: Once | ORAL | 5 refills | Status: AC

## 2017-05-17 NOTE — Addendum Note (Signed)
Addended by: Carola RhineKIGOTHO, Tenae Graziosi N on: 05/17/2017 05:43 PM   Modules accepted: Orders

## 2017-05-17 NOTE — Progress Notes (Signed)
   Subjective:    Patient ID: Brittany [name], female    DOB: 11/02/1965, 51 y.o.   MRN: [id number]  HPI Here for one week of sinus pressure, headache, ear pains, PND, and coughing up yellow sputum. No fever. Using Mucinex, Alka Seltzer Plus and Robitussin.    Review of Systems  Constitutional: Negative.   HENT: Positive for congestion, postnasal drip, sinus pressure, sinus pain and sore throat.   Eyes: Negative.   Respiratory: Positive for cough.        Objective:   Physical Exam  Constitutional: She appears well-developed and well-nourished.  HENT:  Right Ear: External ear normal.  Left Ear: External ear normal.  Nose: Nose normal.  Mouth/Throat: Oropharynx is clear and moist.  Eyes: Conjunctivae are normal.  Neck: Neck supple. No thyromegaly present.  Pulmonary/Chest: Effort normal and breath sounds normal. No respiratory distress. She has no wheezes. She has no rales.  Lymphadenopathy:    She has no cervical adenopathy.          Assessment & Plan:  Sinusitis, treat with Augmentin and a steroid shot. Gershon CraneStephen Sahid Borba, MD

## 2017-05-31 ENCOUNTER — Telehealth: Payer: Self-pay | Admitting: Family Medicine

## 2017-05-31 MED ORDER — AMOXICILLIN-POT CLAVULANATE 875-125 MG PO TABS: 1.0000 | ORAL_TABLET | Freq: Two times a day (BID) | ORAL | 0 refills | Status: DC

## 2017-05-31 NOTE — Telephone Encounter (Signed)
Copied from CRM [id number]. Topic: Quick Communication - See Telephone Encounter >> May 31, 2017  9:14 AM Diana EvesHoyt, Maryann B wrote: CRM for notification. See Telephone encounter for:  Pt was seen 05/17/17 for URI. Pt has since finished her antibiotics and was starting to feel better but now Starting Monday symptoms are coming back far as congestion, ear pain, wheezing, coughing up stuff. But not as much head pressure as last time. She is wanting to know what Dr. Clent RidgesFry suggest she does 05/31/17.

## 2017-05-31 NOTE — Telephone Encounter (Signed)
Sent to PCP ?

## 2017-05-31 NOTE — Telephone Encounter (Signed)
Called spoke with pt. Pt wanted RX sent to [facility] [place] Rx sent.

## 2017-05-31 NOTE — Telephone Encounter (Signed)
Call in another 10 days of Augmentin 875 bid  

## 2017-06-24 ENCOUNTER — Other Ambulatory Visit: Payer: Self-pay | Admitting: Family Medicine

## 2017-08-15 ENCOUNTER — Encounter: Payer: Self-pay | Admitting: Family Medicine

## 2017-08-15 ENCOUNTER — Ambulatory Visit (INDEPENDENT_AMBULATORY_CARE_PROVIDER_SITE_OTHER): Payer: 59 | Admitting: Family Medicine

## 2017-08-15 VITALS — BP 108/62 | HR 83 | Temp 98.0°F | Ht 67.75 in | Wt 180.0 lb

## 2017-08-15 DIAGNOSIS — E119 Type 2 diabetes mellitus without complications: Secondary | ICD-10-CM | POA: Diagnosis not present

## 2017-08-15 DIAGNOSIS — Z0000 Encounter for general adult medical examination without abnormal findings: Secondary | ICD-10-CM

## 2017-08-15 LAB — CBC WITH DIFFERENTIAL/PLATELET
BASOS ABS: 0 10*3/uL (ref 0.0–0.1)
Basophils Relative: 0.8 % (ref 0.0–3.0)
Eosinophils Absolute: 0.1 10*3/uL (ref 0.0–0.7)
Eosinophils Relative: 1.1 % (ref 0.0–5.0)
HEMATOCRIT: 35.5 % — AB (ref 36.0–46.0)
Hemoglobin: 12.1 g/dL (ref 12.0–15.0)
LYMPHS PCT: 32.6 % (ref 12.0–46.0)
Lymphs Abs: 1.5 10*3/uL (ref 0.7–4.0)
MCHC: 34.1 g/dL (ref 30.0–36.0)
MCV: 85.5 fl (ref 78.0–100.0)
MONO ABS: 0.3 10*3/uL (ref 0.1–1.0)
Monocytes Relative: 7.5 % (ref 3.0–12.0)
NEUTROS ABS: 2.7 10*3/uL (ref 1.4–7.7)
Neutrophils Relative %: 58 % (ref 43.0–77.0)
PLATELETS: 240 10*3/uL (ref 150.0–400.0)
RBC: 4.15 Mil/uL (ref 3.87–5.11)
RDW: 15.7 % — ABNORMAL HIGH (ref 11.5–15.5)
WBC: 4.7 10*3/uL (ref 4.0–10.5)

## 2017-08-15 LAB — HEPATIC FUNCTION PANEL
ALBUMIN: 4.5 g/dL (ref 3.5–5.2)
ALT: 31 U/L (ref 0–35)
AST: 21 U/L (ref 0–37)
Alkaline Phosphatase: 70 U/L (ref 39–117)
Bilirubin, Direct: 0.1 mg/dL (ref 0.0–0.3)
Total Bilirubin: 0.4 mg/dL (ref 0.2–1.2)
Total Protein: 7.4 g/dL (ref 6.0–8.3)

## 2017-08-15 LAB — POC URINALSYSI DIPSTICK (AUTOMATED)
Glucose, UA: NEGATIVE
Ketones, UA: NEGATIVE
Leukocytes, UA: NEGATIVE
NITRITE UA: NEGATIVE
Protein, UA: NEGATIVE
RBC UA: NEGATIVE
Spec Grav, UA: 1.025 (ref 1.010–1.025)
UROBILINOGEN UA: 0.2 U/dL
pH, UA: 5.5 (ref 5.0–8.0)

## 2017-08-15 LAB — BASIC METABOLIC PANEL
BUN: 21 mg/dL (ref 6–23)
CALCIUM: 9.6 mg/dL (ref 8.4–10.5)
CO2: 29 mEq/L (ref 19–32)
Chloride: 99 mEq/L (ref 96–112)
Creatinine, Ser: 0.93 mg/dL (ref 0.40–1.20)
GFR: 80.99 mL/min (ref >60.00)
GLUCOSE: 126 mg/dL — AB (ref 70–99)
Potassium: 4.4 mEq/L (ref 3.5–5.1)
Sodium: 135 mEq/L (ref 135–145)

## 2017-08-15 LAB — LIPID PANEL
CHOL/HDL RATIO: 4
CHOLESTEROL: 207 mg/dL — AB (ref 0–200)
HDL: 54.7 mg/dL (ref >39.00)
LDL Cholesterol: 134 mg/dL — ABNORMAL HIGH (ref 0–99)
NonHDL: 151.94
TRIGLYCERIDES: 88 mg/dL (ref 0.0–149.0)
VLDL: 17.6 mg/dL (ref 0.0–40.0)

## 2017-08-15 LAB — HEMOGLOBIN A1C: HEMOGLOBIN A1C: 6.6 % — AB (ref 4.6–6.5)

## 2017-08-15 LAB — TSH: TSH: 2.44 u[IU]/mL (ref 0.35–4.50)

## 2017-08-15 MED ORDER — SAXAGLIPTIN HCL 5 MG PO TABS: 5.0000 mg | ORAL_TABLET | Freq: Every day | ORAL | 3 refills | Status: DC

## 2017-08-15 MED ORDER — LISINOPRIL-HYDROCHLOROTHIAZIDE 20-25 MG PO TABS: 1.0000 | ORAL_TABLET | Freq: Every day | ORAL | 3 refills | Status: DC

## 2017-08-15 MED ORDER — ATORVASTATIN CALCIUM 80 MG PO TABS: 80.0000 mg | ORAL_TABLET | Freq: Every day | ORAL | 3 refills | Status: DC

## 2017-08-15 MED ORDER — OMEPRAZOLE 40 MG PO CPDR: 40.0000 mg | DELAYED_RELEASE_CAPSULE | Freq: Every day | ORAL | 3 refills | Status: DC

## 2017-08-15 MED ORDER — METFORMIN HCL 1000 MG PO TABS: ORAL_TABLET | ORAL | 3 refills | Status: DC

## 2017-08-15 NOTE — Progress Notes (Signed)
   Subjective:    Patient ID: Brittany [name], female    DOB: 01/09/1966, 52 y.o.   MRN: [id number]  HPI Here for a well exam. She is doing well in general. She struggles with pain in the right knee but she takes as little medication as she can. Her Orthopedist has recommended a TKR but she wants to postpone this as long as possible.    Review of Systems  Constitutional: Negative.   HENT: Negative.   Eyes: Negative.   Respiratory: Negative.   Cardiovascular: Negative.   Gastrointestinal: Negative.   Genitourinary: Negative for decreased urine volume, difficulty urinating, dyspareunia, dysuria, enuresis, flank pain, frequency, hematuria, pelvic pain and urgency.  Musculoskeletal: Positive for arthralgias.  Skin: Negative.   Neurological: Negative.   Psychiatric/Behavioral: Negative.        Objective:   Physical Exam  Constitutional: She is oriented to person, place, and time. She appears well-developed and well-nourished. No distress.  HENT:  Head: Normocephalic and atraumatic.  Right Ear: External ear normal.  Left Ear: External ear normal.  Nose: Nose normal.  Mouth/Throat: Oropharynx is clear and moist. No oropharyngeal exudate.  Eyes: Pupils are equal, round, and reactive to light. Conjunctivae and EOM are normal. No scleral icterus.  Neck: Normal range of motion. Neck supple. No JVD present. No thyromegaly present.  Cardiovascular: Normal rate, regular rhythm, normal heart sounds and intact distal pulses. Exam reveals no gallop and no friction rub.  No murmur heard. Pulmonary/Chest: Effort normal and breath sounds normal. No respiratory distress. She has no wheezes. She has no rales. She exhibits no tenderness.  Abdominal: Soft. Bowel sounds are normal. She exhibits no distension and no mass. There is no tenderness. There is no rebound and no guarding.  Musculoskeletal: Normal range of motion. She exhibits no edema or tenderness.  Lymphadenopathy:    She has no  cervical adenopathy.  Neurological: She is alert and oriented to person, place, and time. She has normal reflexes. No cranial nerve deficit. She exhibits normal muscle tone. Coordination normal.  Skin: Skin is warm and dry. No rash noted. No erythema.  Psychiatric: She has a normal mood and affect. Her behavior is normal. Judgment and thought content normal.          Assessment & Plan:  Well exam. We discussed diet and exercise. Get fasting labs. I suggested she try a non-weight bearing exercise like swimming.  Gershon CraneStephen Giorgio Chabot, MD

## 2017-09-23 ENCOUNTER — Other Ambulatory Visit: Payer: Self-pay | Admitting: Family Medicine

## 2018-05-03 ENCOUNTER — Encounter: Payer: Self-pay | Admitting: Family Medicine

## 2018-05-03 NOTE — Telephone Encounter (Signed)
Dr. Fry please advise. Thanks  

## 2018-05-04 NOTE — Telephone Encounter (Signed)
Make an OV so we can check an A1c and talk about this

## 2018-05-08 LAB — HM DIABETES EYE EXAM

## 2018-05-30 ENCOUNTER — Ambulatory Visit (INDEPENDENT_AMBULATORY_CARE_PROVIDER_SITE_OTHER): Payer: 59 | Admitting: Orthopedic Surgery

## 2018-05-30 ENCOUNTER — Encounter (INDEPENDENT_AMBULATORY_CARE_PROVIDER_SITE_OTHER): Payer: Self-pay | Admitting: Orthopedic Surgery

## 2018-05-30 ENCOUNTER — Ambulatory Visit (INDEPENDENT_AMBULATORY_CARE_PROVIDER_SITE_OTHER): Payer: Self-pay

## 2018-05-30 ENCOUNTER — Telehealth (INDEPENDENT_AMBULATORY_CARE_PROVIDER_SITE_OTHER): Payer: Self-pay

## 2018-05-30 DIAGNOSIS — M25561 Pain in right knee: Secondary | ICD-10-CM | POA: Diagnosis not present

## 2018-05-30 DIAGNOSIS — G8929 Other chronic pain: Secondary | ICD-10-CM | POA: Diagnosis not present

## 2018-05-30 MED ORDER — CELECOXIB 200 MG PO CAPS: ORAL_CAPSULE | ORAL | 2 refills | Status: DC

## 2018-05-30 MED ORDER — BUPIVACAINE HCL 0.25 % IJ SOLN
4.0000 mL | INTRAMUSCULAR | Status: AC | PRN
  Administered 2018-05-30: 4 mL via INTRA_ARTICULAR

## 2018-05-30 MED ORDER — METHYLPREDNISOLONE ACETATE 40 MG/ML IJ SUSP
40.0000 mg | INTRAMUSCULAR | Status: AC | PRN
  Administered 2018-05-30: 40 mg via INTRA_ARTICULAR

## 2018-05-30 MED ORDER — LIDOCAINE HCL 1 % IJ SOLN
5.0000 mL | INTRAMUSCULAR | Status: AC | PRN
  Administered 2018-05-30: 5 mL

## 2018-05-30 NOTE — Progress Notes (Signed)
Office Visit Note   Patient: Brittany [name]           Date of Birth: 07/09/1965           MRN: [id number] Visit Date: 05/30/2018 Requested by: Nelwyn SalisburyFry, Stephen A, MD [address] [place], [place] [id number] PCP: Nelwyn SalisburyFry, Stephen A, MD  Subjective: Chief Complaint  Patient presents with  . Right Knee - Pain    HPI: Brittany [name] is a patient with right knee pain.  She had some type of injection and work-up over 3 years ago.  She is had recurrent pain but no recurrent injury.  Localizes the pain almost exclusively laterally in the right knee.  She states it is interfering with her ADLs and walking.  She is tried multiple anti-inflammatories including Aleve without relief ibuprofen and Mobic.  She does take omeprazole for reflux.              ROS: All systems reviewed are negative as they relate to the chief complaint within the history of present illness.  Patient denies  fevers or chills.   Assessment & Plan: Visit Diagnoses:  1. Chronic pain of right knee     Plan: Impression is lateral compartment arthritis right knee.  Plan is for her to start Celebrex as needed 1 twice a day for a month then daily as needed.  In regards to that right knee I think she may be a candidate for lateral compartment unicompartmental right knee replacement.  For now we will start her on a cortisone injection today plus every 3 month injections alternating between gel and cortisone.  I will see her back after this cortisone shot wears off and we will do Synvisc 1 into that right knee.  Follow-Up Instructions: No follow-ups on file.   Orders:  Orders Placed This Encounter  Procedures  . XR KNEE 3 VIEW RIGHT   Meds ordered this encounter  Medications  . celecoxib (CELEBREX) 200 MG capsule    Sig: Take 1 po bid x 30 days, then 1 qd prn    Dispense:  60 capsule    Refill:  2      Procedures: Large Joint Inj: R knee on 05/30/2018 5:43 PM Indications: diagnostic evaluation, joint swelling and  pain Details: 18 G 1.5 in needle, superolateral approach  Arthrogram: No  Medications: 5 mL lidocaine 1 %; 40 mg methylPREDNISolone acetate 40 MG/ML; 4 mL bupivacaine 0.25 % Outcome: tolerated well, no immediate complications Procedure, treatment alternatives, risks and benefits explained, specific risks discussed. Consent was given by the patient. Immediately prior to procedure a time out was called to verify the correct patient, procedure, equipment, support staff and site/side marked as required. Patient was prepped and draped in the usual sterile fashion.       Clinical Data: No additional findings.  Objective: Vital Signs: There were no vitals taken for this visit.  Physical Exam:   Constitutional: Patient appears well-developed HEENT:  Head: Normocephalic Eyes:EOM are normal Neck: Normal range of motion Cardiovascular: Normal rate Pulmonary/chest: Effort normal Neurologic: Patient is alert Skin: Skin is warm Psychiatric: Patient has normal mood and affect    Ortho Exam: Ortho exam demonstrates no effusion in the right knee but slight valgus alignment.  Extensor mechanism is intact and the range of motion is full.  ACL and PCL are intact.  Lateral joint line tenderness is present.  No patellar apprehension.  Specialty Comments:  No specialty comments available.  Imaging: Xr Knee 3 View Right  Result Date: 05/30/2018 AP lateral merchant right knee reviewed.  Lateral compartment arthritis is present.  Medial compartment appears to be spared.  Patellofemoral joint has some peripheral osteophytes but again worse on the lateral side.  No fracture or dislocation present.    PMFS History: Patient Active Problem List   Diagnosis Date Noted  . CANDIDIASIS, VAGINAL 06/01/2010  . ACUTE SINUSITIS, UNSPECIFIED 06/01/2010  . CARPAL TUNNEL SYNDROME 06/12/2009  . LATERAL EPICONDYLITIS 06/12/2009  . Diabetes mellitus without complication (HCC) 07/22/2008  . GERD 04/18/2007   . Hyperlipidemia 10/23/2006  . HYPERTENSION 10/23/2006   Past Medical History:  Diagnosis Date  . Diabetes mellitus    type II  . GERD (gastroesophageal reflux disease)   . Hyperlipidemia   . Hypertension     Family History  Problem Relation Age of Onset  . Arthritis Other   . Heart disease Other   . Diabetes Other   . Hyperlipidemia Other   . Hypertension Other   . Stroke Other     Past Surgical History:  Procedure Laterality Date  . COLONOSCOPY  04/22/2016   per Dr. Loreta AveMann, adenomatous polyps, repeat in 5 yrs   . ESOPHAGOGASTRODUODENOSCOPY  04/22/2016   per Dr. Loreta AveMann, normal except small hiatal hernia   . VAGINAL HYSTERECTOMY     ovaries intact   Social History   Occupational History  . Not on file  Tobacco Use  . Smoking status: Never Smoker  . Smokeless tobacco: Never Used  Substance and Sexual Activity  . Alcohol use: Yes    Alcohol/week: 0.0 standard drinks    Comment: occ  . Drug use: No  . Sexual activity: Not on file

## 2018-05-30 NOTE — Telephone Encounter (Signed)
Dr. August Saucerean is requesting Synvisc One for this patient's right knee - OA.

## 2018-06-05 NOTE — Telephone Encounter (Signed)
Noted  

## 2018-06-13 ENCOUNTER — Encounter (INDEPENDENT_AMBULATORY_CARE_PROVIDER_SITE_OTHER): Payer: Self-pay | Admitting: Orthopedic Surgery

## 2018-06-14 ENCOUNTER — Telehealth (INDEPENDENT_AMBULATORY_CARE_PROVIDER_SITE_OTHER): Payer: Self-pay

## 2018-06-14 NOTE — Telephone Encounter (Signed)
Submitted VOB for Durolane, right knee. 

## 2018-07-06 ENCOUNTER — Telehealth (INDEPENDENT_AMBULATORY_CARE_PROVIDER_SITE_OTHER): Payer: Self-pay

## 2018-07-06 NOTE — Telephone Encounter (Addendum)
Called and left a VM advising patient to CB to schedule an appointment for gel injection with Dr. August Saucer. Per Scarlette Calico at [facility] pateint is covered at 100% after $75.00 Co-pay Reference# [id number]/14/[id number]   Approved for Durolane, right knee. Buy & Bill Covered at 100% after Co-pay Co-pay of $75.00 No PA required

## 2018-08-20 ENCOUNTER — Encounter: Payer: Self-pay | Admitting: Family Medicine

## 2018-08-21 ENCOUNTER — Ambulatory Visit: Payer: No Typology Code available for payment source | Admitting: Family Medicine

## 2018-08-22 ENCOUNTER — Other Ambulatory Visit: Payer: Self-pay

## 2018-08-22 ENCOUNTER — Ambulatory Visit (INDEPENDENT_AMBULATORY_CARE_PROVIDER_SITE_OTHER): Payer: No Typology Code available for payment source | Admitting: Family Medicine

## 2018-08-22 ENCOUNTER — Encounter: Payer: Self-pay | Admitting: Family Medicine

## 2018-08-22 DIAGNOSIS — E119 Type 2 diabetes mellitus without complications: Secondary | ICD-10-CM | POA: Diagnosis not present

## 2018-08-22 DIAGNOSIS — M150 Primary generalized (osteo)arthritis: Secondary | ICD-10-CM

## 2018-08-22 DIAGNOSIS — E782 Mixed hyperlipidemia: Secondary | ICD-10-CM

## 2018-08-22 DIAGNOSIS — K219 Gastro-esophageal reflux disease without esophagitis: Secondary | ICD-10-CM | POA: Diagnosis not present

## 2018-08-22 DIAGNOSIS — M1990 Unspecified osteoarthritis, unspecified site: Secondary | ICD-10-CM | POA: Insufficient documentation

## 2018-08-22 DIAGNOSIS — I10 Essential (primary) hypertension: Secondary | ICD-10-CM | POA: Diagnosis not present

## 2018-08-22 DIAGNOSIS — M159 Polyosteoarthritis, unspecified: Secondary | ICD-10-CM

## 2018-08-22 DIAGNOSIS — F419 Anxiety disorder, unspecified: Secondary | ICD-10-CM

## 2018-08-22 MED ORDER — ATORVASTATIN CALCIUM 80 MG PO TABS: 80.0000 mg | ORAL_TABLET | Freq: Every day | ORAL | 3 refills | Status: DC

## 2018-08-22 MED ORDER — LISINOPRIL-HYDROCHLOROTHIAZIDE 20-25 MG PO TABS: 1.0000 | ORAL_TABLET | Freq: Every day | ORAL | 3 refills | Status: DC

## 2018-08-22 MED ORDER — OMEPRAZOLE 40 MG PO CPDR: 40.0000 mg | DELAYED_RELEASE_CAPSULE | Freq: Every day | ORAL | 3 refills | Status: DC

## 2018-08-22 MED ORDER — LORAZEPAM 2 MG PO TABS: 2.0000 mg | ORAL_TABLET | Freq: Three times a day (TID) | ORAL | 1 refills | Status: DC | PRN

## 2018-08-22 MED ORDER — METFORMIN HCL 1000 MG PO TABS: ORAL_TABLET | ORAL | 3 refills | Status: DC

## 2018-08-22 MED ORDER — SAXAGLIPTIN HCL 5 MG PO TABS: 5.0000 mg | ORAL_TABLET | Freq: Every day | ORAL | 3 refills | Status: DC

## 2018-08-22 NOTE — Progress Notes (Signed)
Subjective:    Patient ID: Brittany [name], female    DOB: 04/26/1966, 53 y.o.   MRN: [id number]  HPI Virtual Visit via Video Note  I connected with the patient on 08/22/18 at 11:00 AM EDT by a video enabled telemedicine application and verified that I am speaking with the correct person using two identifiers.  Location patient: home Location provider:work or home office Persons participating in the virtual visit: patient, provider  I discussed the limitations of evaluation and management by telemedicine and the availability of in person appointments. The patient expressed understanding and agreed to proceed.   HPI: We discussed several issues today. She has been feeling well except for joint pains, especially the knees and the hands. Dr. August Saucer has started her on Celebrex, and this helps to some extent. She is watching her diet, but she has not checked her glucoses for awhile. She has not checked her BP for awhile either. Her GERD is stable. No chest pains or cough or SOB. She also mentions her anxiety levels have been high lately, especially with the Covid-19 virus pandemic. The 1 mg Lorazepam she takes does not help as well as it used to.    ROS: See pertinent positives and negatives per HPI.  Past Medical History:  Diagnosis Date  . Diabetes mellitus    type II  . GERD (gastroesophageal reflux disease)   . Hyperlipidemia   . Hypertension     Past Surgical History:  Procedure Laterality Date  . COLONOSCOPY  04/22/2016   per Dr. Loreta Ave, adenomatous polyps, repeat in 5 yrs   . ESOPHAGOGASTRODUODENOSCOPY  04/22/2016   per Dr. Loreta Ave, normal except small hiatal hernia   . VAGINAL HYSTERECTOMY     ovaries intact    Family History  Problem Relation Age of Onset  . Arthritis Other   . Heart disease Other   . Diabetes Other   . Hyperlipidemia Other   . Hypertension Other   . Stroke Other      Current Outpatient Medications:  .  aspirin 81 MG chewable tablet, Chew 1  tablet (81 mg total) by mouth daily., Disp: 30 tablet, Rfl: 0 .  atorvastatin (LIPITOR) 80 MG tablet, Take 1 tablet (80 mg total) by mouth daily., Disp: 90 tablet, Rfl: 3 .  celecoxib (CELEBREX) 200 MG capsule, Take 1 po bid x 30 days, then 1 qd prn, Disp: 60 capsule, Rfl: 2 .  glucose blood (ONETOUCH VERIO) test strip, Test once daily Dx. Code: E11.9, Disp: 100 each, Rfl: 2 .  lisinopril-hydrochlorothiazide (PRINZIDE,ZESTORETIC) 20-25 MG tablet, Take 1 tablet by mouth daily., Disp: 90 tablet, Rfl: 3 .  LORazepam (ATIVAN) 2 MG tablet, Take 1 tablet (2 mg total) by mouth every 8 (eight) hours as needed for anxiety., Disp: 270 tablet, Rfl: 1 .  metFORMIN (GLUCOPHAGE) 1000 MG tablet, TAKE 1 TABLET BY MOUTH TWO  TIMES DAILY WITH MEALS, Disp: 180 tablet, Rfl: 3 .  omeprazole (PRILOSEC) 40 MG capsule, Take 1 capsule (40 mg total) by mouth daily., Disp: 90 capsule, Rfl: 3 .  ONETOUCH DELICA LANCETS FINE MISC, TEST ONCE A DAY, Disp: 100 each, Rfl: 1 .  saxagliptin HCl (ONGLYZA) 5 MG TABS tablet, Take 1 tablet (5 mg total) by mouth daily., Disp: 90 tablet, Rfl: 3  EXAM:  VITALS per patient if applicable:  GENERAL: alert, oriented, appears well and in no acute distress  HEENT: atraumatic, conjunttiva clear, no obvious abnormalities on inspection of external nose and ears  NECK: normal movements  of the head and neck  LUNGS: on inspection no signs of respiratory distress, breathing rate appears normal, no obvious gross SOB, gasping or wheezing  CV: no obvious cyanosis  MS: moves all visible extremities without noticeable abnormality  PSYCH/NEURO: pleasant and cooperative, no obvious depression or anxiety, speech and thought processing grossly intact  ASSESSMENT AND PLAN: Her GERD is stable. I asked her to check her BP once or twice a week at home and report back to me. We will set up fasting labs soon for lipids, an A1c, etc. I encouraged her to walk outside for exercise. As for the joint pains,  she can add Tylenol to the Celebrex as needed. I also suggested she try taking turmeric daily. For the anxiety we will increase the Lorazepam to 2 mg tid prn.  Gershon Crane, MD  Discussed the following assessment and plan:  Essential hypertension  Gastroesophageal reflux disease, esophagitis presence not specified  Diabetes mellitus without complication (HCC) - Plan: Lipid panel, Basic metabolic panel, Hepatic function panel, TSH, POCT Urinalysis Dipstick (Automated), CBC with Differential/Platelet, Hemoglobin A1c  Mixed hyperlipidemia     I discussed the assessment and treatment plan with the patient. The patient was provided an opportunity to ask questions and all were answered. The patient agreed with the plan and demonstrated an understanding of the instructions.   The patient was advised to call back or seek an in-person evaluation if the symptoms worsen or if the condition fails to improve as anticipated.     Review of Systems     Objective:   Physical Exam        Assessment & Plan:

## 2018-08-24 ENCOUNTER — Other Ambulatory Visit: Payer: Self-pay

## 2018-08-24 ENCOUNTER — Other Ambulatory Visit (INDEPENDENT_AMBULATORY_CARE_PROVIDER_SITE_OTHER): Payer: No Typology Code available for payment source

## 2018-08-24 DIAGNOSIS — E119 Type 2 diabetes mellitus without complications: Secondary | ICD-10-CM

## 2018-08-24 LAB — BASIC METABOLIC PANEL
BUN: 12 mg/dL (ref 6–23)
CO2: 31 mEq/L (ref 19–32)
Calcium: 9.7 mg/dL (ref 8.4–10.5)
Chloride: 98 mEq/L (ref 96–112)
Creatinine, Ser: 1.03 mg/dL (ref 0.40–1.20)
GFR: 67.46 mL/min (ref >60.00)
Glucose, Bld: 125 mg/dL — ABNORMAL HIGH (ref 70–99)
Potassium: 4.1 mEq/L (ref 3.5–5.1)
Sodium: 140 mEq/L (ref 135–145)

## 2018-08-24 LAB — CBC WITH DIFFERENTIAL/PLATELET
Basophils Absolute: 0 10*3/uL (ref 0.0–0.1)
Basophils Relative: 0.6 % (ref 0.0–3.0)
Eosinophils Absolute: 0 10*3/uL (ref 0.0–0.7)
Eosinophils Relative: 1.1 % (ref 0.0–5.0)
HCT: 34.4 % — ABNORMAL LOW (ref 36.0–46.0)
Hemoglobin: 11.7 g/dL — ABNORMAL LOW (ref 12.0–15.0)
Lymphocytes Relative: 36.4 % (ref 12.0–46.0)
Lymphs Abs: 1.5 10*3/uL (ref 0.7–4.0)
MCHC: 33.9 g/dL (ref 30.0–36.0)
MCV: 88.3 fl (ref 78.0–100.0)
Monocytes Absolute: 0.3 10*3/uL (ref 0.1–1.0)
Monocytes Relative: 7.9 % (ref 3.0–12.0)
Neutro Abs: 2.3 10*3/uL (ref 1.4–7.7)
Neutrophils Relative %: 54 % (ref 43.0–77.0)
Platelets: 207 10*3/uL (ref 150.0–400.0)
RBC: 3.9 Mil/uL (ref 3.87–5.11)
RDW: 14.4 % (ref 11.5–15.5)
WBC: 4.2 10*3/uL (ref 4.0–10.5)

## 2018-08-24 LAB — POC URINALSYSI DIPSTICK (AUTOMATED)
Blood, UA: NEGATIVE
Glucose, UA: NEGATIVE
Ketones, UA: NEGATIVE
Leukocytes, UA: NEGATIVE
Nitrite, UA: NEGATIVE
Protein, UA: NEGATIVE
Spec Grav, UA: 1.015 (ref 1.010–1.025)
Urobilinogen, UA: 0.2 E.U./dL
pH, UA: 7 (ref 5.0–8.0)

## 2018-08-24 LAB — LIPID PANEL
Cholesterol: 248 mg/dL — ABNORMAL HIGH (ref 0–200)
HDL: 47.6 mg/dL (ref >39.00)
LDL Cholesterol: 162 mg/dL — ABNORMAL HIGH (ref 0–99)
NonHDL: 200.61
Total CHOL/HDL Ratio: 5
Triglycerides: 192 mg/dL — ABNORMAL HIGH (ref 0.0–149.0)
VLDL: 38.4 mg/dL (ref 0.0–40.0)

## 2018-08-24 LAB — TSH: TSH: 3.21 u[IU]/mL (ref 0.35–4.50)

## 2018-08-24 LAB — HEPATIC FUNCTION PANEL
ALT: 20 U/L (ref 0–35)
AST: 16 U/L (ref 0–37)
Albumin: 4.4 g/dL (ref 3.5–5.2)
Alkaline Phosphatase: 80 U/L (ref 39–117)
Bilirubin, Direct: 0.1 mg/dL (ref 0.0–0.3)
Total Bilirubin: 0.4 mg/dL (ref 0.2–1.2)
Total Protein: 7.1 g/dL (ref 6.0–8.3)

## 2018-08-24 LAB — HEMOGLOBIN A1C: Hgb A1c MFr Bld: 6.6 % — ABNORMAL HIGH (ref 4.6–6.5)

## 2018-08-28 ENCOUNTER — Encounter: Payer: Self-pay | Admitting: *Deleted

## 2018-08-28 NOTE — Telephone Encounter (Signed)
Dr. Clent Ridges please advise on the [facility] and 3000 mcg b12 gummy vitamin that the pt has been taking.

## 2018-08-28 NOTE — Telephone Encounter (Signed)
Yes these are fine to take

## 2018-09-11 ENCOUNTER — Encounter (INDEPENDENT_AMBULATORY_CARE_PROVIDER_SITE_OTHER): Payer: Self-pay | Admitting: Orthopedic Surgery

## 2018-09-12 MED ORDER — CELECOXIB 200 MG PO CAPS: ORAL_CAPSULE | ORAL | 2 refills | Status: DC

## 2018-09-12 NOTE — Telephone Encounter (Signed)
ok 

## 2018-10-10 ENCOUNTER — Encounter: Payer: Self-pay | Admitting: Family Medicine

## 2018-10-17 NOTE — Telephone Encounter (Signed)
Form placed in the red folder to be signed by Dr. Clent Ridges.  Please advise once completed.  Thanks

## 2018-10-18 NOTE — Telephone Encounter (Signed)
What accomodations is she looking for?

## 2018-10-29 ENCOUNTER — Encounter: Payer: Self-pay | Admitting: Family Medicine

## 2018-10-29 NOTE — Telephone Encounter (Signed)
Dr. Fry please advise. Thanks  

## 2018-10-29 NOTE — Telephone Encounter (Signed)
The rx for the chair is in your folder to fax

## 2018-12-14 ENCOUNTER — Ambulatory Visit (INDEPENDENT_AMBULATORY_CARE_PROVIDER_SITE_OTHER): Payer: No Typology Code available for payment source | Admitting: Orthopedic Surgery

## 2018-12-14 ENCOUNTER — Ambulatory Visit: Payer: Self-pay

## 2018-12-14 ENCOUNTER — Encounter: Payer: Self-pay | Admitting: Orthopedic Surgery

## 2018-12-14 DIAGNOSIS — M25512 Pain in left shoulder: Secondary | ICD-10-CM

## 2018-12-14 DIAGNOSIS — M19012 Primary osteoarthritis, left shoulder: Secondary | ICD-10-CM | POA: Diagnosis not present

## 2018-12-14 DIAGNOSIS — M1711 Unilateral primary osteoarthritis, right knee: Secondary | ICD-10-CM

## 2018-12-14 MED ORDER — BUPIVACAINE HCL 0.25 % IJ SOLN
0.6600 mL | INTRAMUSCULAR | Status: AC | PRN
  Administered 2018-12-14: .66 mL via INTRA_ARTICULAR

## 2018-12-14 MED ORDER — METHYLPREDNISOLONE ACETATE 40 MG/ML IJ SUSP
13.3300 mg | INTRAMUSCULAR | Status: AC | PRN
  Administered 2018-12-14: 13.33 mg via INTRA_ARTICULAR

## 2018-12-14 MED ORDER — LIDOCAINE HCL 1 % IJ SOLN
5.0000 mL | INTRAMUSCULAR | Status: AC | PRN
  Administered 2018-12-14: 5 mL

## 2018-12-14 MED ORDER — LIDOCAINE HCL 1 % IJ SOLN
3.0000 mL | INTRAMUSCULAR | Status: AC | PRN
  Administered 2018-12-14: 3 mL

## 2018-12-14 MED ORDER — SODIUM HYALURONATE 60 MG/3ML IX PRSY
60.0000 mg | PREFILLED_SYRINGE | INTRA_ARTICULAR | Status: AC | PRN
  Administered 2018-12-14: 60 mg via INTRA_ARTICULAR

## 2018-12-14 NOTE — Progress Notes (Signed)
Office Visit Note   Patient: Brittany [name]           Date of Birth: August 04, 1965           MRN: [id number] Visit Date: 12/14/2018 Requested by: Laurey Morale, MD [place],  [place] [address] PCP: Laurey Morale, MD  Subjective: Chief Complaint  Patient presents with  . Left Shoulder - Pain  . Right Knee - Pain    HPI: Brittany [name] is a patient with left shoulder pain of 2 months duration atraumatic onset.  She also reports bilateral knee pain right worse than left.  It is painful for her to weight-bear at times.  She like to get the Durolane injection in the right knee.  She has known history of arthritis in that knee.  In regards to the shoulder she is having pain primarily in the superior aspect of the shoulder worse with overhead motion.  She is taking some over-the-counter medication for that problem.  Denies any neck pain or radicular symptoms.  She does report some catching and popping localizing to the superior aspect of the left shoulder.              ROS: All systems reviewed are negative as they relate to the chief complaint within the history of present illness.  Patient denies  fevers or chills.   Assessment & Plan: Visit Diagnoses:  1. Left shoulder pain, unspecified chronicity   2. Arthritis of left acromioclavicular joint   3. Arthritis of right knee     Plan: Impression is symptomatic left AC joint arthritis and inflammation.  Cuff strength is good no evidence of frozen shoulder.  Ultrasound-guided AC joint injection performed today.  In regards to the right knee I think that the patient will have a Durolane injection performed today.  Continue with non-loadbearing quad strengthening exercises.  Follow-up as needed  Follow-Up Instructions: Return if symptoms worsen or fail to improve.   Orders:  Orders Placed This Encounter  Procedures  . XR Shoulder Left   No orders of the defined types were placed in this encounter.     Procedures:  Large Joint Inj: R knee on 12/14/2018 11:47 AM Indications: diagnostic evaluation, joint swelling and pain Details: 18 G 1.5 in needle, superolateral approach  Arthrogram: No  Medications: 5 mL lidocaine 1 %; 60 mg Sodium Hyaluronate 60 MG/3ML Outcome: tolerated well, no immediate complications Procedure, treatment alternatives, risks and benefits explained, specific risks discussed. Consent was given by the patient. Immediately prior to procedure a time out was called to verify the correct patient, procedure, equipment, support staff and site/side marked as required. Patient was prepped and draped in the usual sterile fashion.   Medium Joint Inj: L acromioclavicular on 12/14/2018 11:48 AM Indications: pain and diagnostic evaluation Details: 27 G 1.5 in needle, ultrasound-guided superior approach Medications: 13.33 mg methylPREDNISolone acetate 40 MG/ML; 0.66 mL bupivacaine 0.25 %; 3 mL lidocaine 1 % Outcome: tolerated well, no immediate complications Procedure, treatment alternatives, risks and benefits explained, specific risks discussed. Consent was given by the patient. Immediately prior to procedure a time out was called to verify the correct patient, procedure, equipment, support staff and site/side marked as required. Patient was prepped and draped in the usual sterile fashion.       Clinical Data: No additional findings.  Objective: Vital Signs: There were no vitals taken for this visit.  Physical Exam:   Constitutional: Patient appears well-developed HEENT:  Head: Normocephalic Eyes:EOM are normal  Neck: Normal range of motion Cardiovascular: Normal rate Pulmonary/chest: Effort normal Neurologic: Patient is alert Skin: Skin is warm Psychiatric: Patient has normal mood and affect    Ortho Exam: Ortho exam demonstrates full active and passive range of motion of bilateral shoulders.  She does have AC joint tenderness on the left not on the right.  Some pain with crossarm  adduction.  Impingement signs equivocal on the left negative on the right.  O'Brien's testing negative.  She has good shoulder stability and no restriction of passive range of motion on the left-hand side.  On the right knee she has no real effusion but some pain with range of motion.  Extensor mechanism is intact pedal pulses palpable no groin pain with internal X rotation of the leg.  Range of motion is easily past 90 degrees of flexion.  Specialty Comments:  No specialty comments available.  Imaging: Xr Shoulder Left  Result Date: 12/14/2018 AP outlet axillary left shoulder reviewed.  AC joint arthritis is present.  Glenohumeral joint is intact.  No acute fracture.  Visualized lung fields clear.    PMFS History: Patient Active Problem List   Diagnosis Date Noted  . Osteoarthritis 08/22/2018  . Anxiety disorder 08/22/2018  . CARPAL TUNNEL SYNDROME 06/12/2009  . Diabetes mellitus without complication (HCC) 07/22/2008  . GERD 04/18/2007  . Hyperlipidemia 10/23/2006  . Essential hypertension 10/23/2006   Past Medical History:  Diagnosis Date  . Diabetes mellitus    type II  . GERD (gastroesophageal reflux disease)   . Hyperlipidemia   . Hypertension     Family History  Problem Relation Age of Onset  . Arthritis Other   . Heart disease Other   . Diabetes Other   . Hyperlipidemia Other   . Hypertension Other   . Stroke Other     Past Surgical History:  Procedure Laterality Date  . COLONOSCOPY  04/22/2016   per Dr. Loreta AveMann, adenomatous polyps, repeat in 5 yrs   . ESOPHAGOGASTRODUODENOSCOPY  04/22/2016   per Dr. Loreta AveMann, normal except small hiatal hernia   . VAGINAL HYSTERECTOMY     ovaries intact   Social History   Occupational History  . Not on file  Tobacco Use  . Smoking status: Never Smoker  . Smokeless tobacco: Never Used  Substance and Sexual Activity  . Alcohol use: Yes    Alcohol/week: 0.0 standard drinks    Comment: occ  . Drug use: No  . Sexual activity:  Not on file

## 2019-03-25 ENCOUNTER — Encounter: Payer: Self-pay | Admitting: Family Medicine

## 2019-03-27 NOTE — Telephone Encounter (Signed)
Lm for pt to return call. Pt needs to stop cipro and start Augmentin.

## 2019-04-02 ENCOUNTER — Encounter: Payer: Self-pay | Admitting: Family Medicine

## 2019-04-04 NOTE — Telephone Encounter (Signed)
Tell her that the cancer possibility only refers to extended release metformin. The immediate release that she takes is perfectly safe

## 2019-04-09 NOTE — Telephone Encounter (Signed)
That's okay. Stop the Metformin. Increase the Onglyza to 2 tabs (a total of 10 mg) daily and report back in 2 weeks about her daily glucoses

## 2019-04-09 NOTE — Telephone Encounter (Signed)
Please advise 

## 2019-06-04 ENCOUNTER — Telehealth (INDEPENDENT_AMBULATORY_CARE_PROVIDER_SITE_OTHER): Payer: No Typology Code available for payment source | Admitting: Family Medicine

## 2019-06-04 ENCOUNTER — Other Ambulatory Visit: Payer: Self-pay

## 2019-06-04 ENCOUNTER — Encounter: Payer: Self-pay | Admitting: Family Medicine

## 2019-06-04 DIAGNOSIS — A084 Viral intestinal infection, unspecified: Secondary | ICD-10-CM

## 2019-06-04 MED ORDER — DIPHENOXYLATE-ATROPINE 2.5-0.025 MG PO TABS: 2.0000 | ORAL_TABLET | Freq: Four times a day (QID) | ORAL | 0 refills | Status: DC | PRN

## 2019-06-04 NOTE — Progress Notes (Signed)
Virtual Visit via Video Note  I connected with the patient on 06/04/19 at 11:35 AM EST by a video enabled telemedicine application and verified that I am speaking with the correct person using two identifiers.  Location patient: home Location provider:work or home office Persons participating in the virtual visit: patient, provider  I discussed the limitations of evaluation and management by telemedicine and the availability of in person appointments. The patient expressed understanding and agreed to proceed.   HPI: Here for 3 days of watery diarrhea and fatigue. This started the morning after she ate a burger and fries from a local [employer]. The first 24 hours she also had nausea and vomiting, but these have subsided. No fever or cramps or abdominal pain. She has a mild headache but no ST or cough or SOB. No body aches. No one else in the house is sick. She is drinking water and Gatorade and taking some Pepto-Bismol. She is taking all her regular medications but she has not checked her glucoses this week.    ROS: See pertinent positives and negatives per HPI.  Past Medical History:  Diagnosis Date  . Diabetes mellitus    type II  . GERD (gastroesophageal reflux disease)   . Hyperlipidemia   . Hypertension     Past Surgical History:  Procedure Laterality Date  . COLONOSCOPY  04/22/2016   per Dr. Loreta Ave, adenomatous polyps, repeat in 5 yrs   . ESOPHAGOGASTRODUODENOSCOPY  04/22/2016   per Dr. Loreta Ave, normal except small hiatal hernia   . VAGINAL HYSTERECTOMY     ovaries intact    Family History  Problem Relation Age of Onset  . Arthritis Other   . Heart disease Other   . Diabetes Other   . Hyperlipidemia Other   . Hypertension Other   . Stroke Other      Current Outpatient Medications:  .  aspirin 81 MG chewable tablet, Chew 1 tablet (81 mg total) by mouth daily., Disp: 30 tablet, Rfl: 0 .  atorvastatin (LIPITOR) 80 MG tablet, Take 1 tablet (80 mg total) by mouth  daily., Disp: 90 tablet, Rfl: 3 .  celecoxib (CELEBREX) 200 MG capsule, Take 1 po bid x 30 days, then 1 qd prn, Disp: 60 capsule, Rfl: 2 .  diphenoxylate-atropine (LOMOTIL) 2.5-0.025 MG tablet, Take 2 tablets by mouth 4 (four) times daily as needed for diarrhea or loose stools., Disp: 60 tablet, Rfl: 0 .  glucose blood (ONETOUCH VERIO) test strip, Test once daily Dx. Code: E11.9, Disp: 100 each, Rfl: 2 .  lisinopril-hydrochlorothiazide (PRINZIDE,ZESTORETIC) 20-25 MG tablet, Take 1 tablet by mouth daily., Disp: 90 tablet, Rfl: 3 .  LORazepam (ATIVAN) 2 MG tablet, Take 1 tablet (2 mg total) by mouth every 8 (eight) hours as needed for anxiety., Disp: 270 tablet, Rfl: 1 .  metFORMIN (GLUCOPHAGE) 1000 MG tablet, TAKE 1 TABLET BY MOUTH TWO  TIMES DAILY WITH MEALS, Disp: 180 tablet, Rfl: 3 .  omeprazole (PRILOSEC) 40 MG capsule, Take 1 capsule (40 mg total) by mouth daily., Disp: 90 capsule, Rfl: 3 .  ONETOUCH DELICA LANCETS FINE MISC, TEST ONCE A DAY, Disp: 100 each, Rfl: 1 .  saxagliptin HCl (ONGLYZA) 5 MG TABS tablet, Take 1 tablet (5 mg total) by mouth daily., Disp: 90 tablet, Rfl: 3  EXAM:  VITALS per patient if applicable:  GENERAL: alert, oriented, appears well and in no acute distress  HEENT: atraumatic, conjunttiva clear, no obvious abnormalities on inspection of external nose and ears  NECK:  normal movements of the head and neck  LUNGS: on inspection no signs of respiratory distress, breathing rate appears normal, no obvious gross SOB, gasping or wheezing  CV: no obvious cyanosis  MS: moves all visible extremities without noticeable abnormality  PSYCH/NEURO: pleasant and cooperative, no obvious depression or anxiety, speech and thought processing grossly intact  ASSESSMENT AND PLAN: Viral enteritis. Drink fluids and try Lomotil as needed. This should resolve over the next few days. I asked her to check her glucoses several times a day until this resolves. Recheck prn.  Alysia Penna,  MD  Discussed the following assessment and plan:  No diagnosis found.     I discussed the assessment and treatment plan with the patient. The patient was provided an opportunity to ask questions and all were answered. The patient agreed with the plan and demonstrated an understanding of the instructions.   The patient was advised to call back or seek an in-person evaluation if the symptoms worsen or if the condition fails to improve as anticipated.

## 2019-07-18 LAB — HM MAMMOGRAPHY

## 2019-07-22 ENCOUNTER — Other Ambulatory Visit: Payer: Self-pay | Admitting: Family Medicine

## 2019-07-22 NOTE — Telephone Encounter (Signed)
Last filled 08/22/2018 Last OV 06/13/2019  Ok to fill?

## 2019-07-25 ENCOUNTER — Encounter: Payer: Self-pay | Admitting: Family Medicine

## 2019-09-27 ENCOUNTER — Other Ambulatory Visit: Payer: Self-pay | Admitting: Family Medicine

## 2019-10-18 ENCOUNTER — Other Ambulatory Visit (INDEPENDENT_AMBULATORY_CARE_PROVIDER_SITE_OTHER): Payer: Self-pay | Admitting: Orthopedic Surgery

## 2019-10-18 ENCOUNTER — Telehealth: Payer: Self-pay | Admitting: Family Medicine

## 2019-10-18 ENCOUNTER — Other Ambulatory Visit: Payer: Self-pay | Admitting: Family Medicine

## 2019-10-18 NOTE — Telephone Encounter (Signed)
Pt calling wants to know why her medication refill was denied. Please advise.

## 2019-10-18 NOTE — Telephone Encounter (Signed)
Please advise 

## 2019-10-19 NOTE — Telephone Encounter (Signed)
Needs repeat BMP as last one was >1 year ago and her Cr was high-normal

## 2019-10-22 ENCOUNTER — Encounter: Payer: Self-pay | Admitting: Family Medicine

## 2019-10-22 NOTE — Telephone Encounter (Signed)
Pt is scheduled for a CPE 06/23 at 9:00am. Pt is wondering if her medication can be re55filled until she is able to be seen?   Medication are attached and pharmacy is [facility]   Pt would like a call back about the refill at  [phone]

## 2019-10-22 NOTE — Addendum Note (Signed)
Addended by: Solon Augusta on: 10/22/2019 12:15 PM   Modules accepted: Orders

## 2019-10-22 NOTE — Telephone Encounter (Signed)
Please see other phone note

## 2019-10-22 NOTE — Telephone Encounter (Signed)
Ok for a temporary supply? Medications pending.

## 2019-10-23 MED ORDER — LISINOPRIL-HYDROCHLOROTHIAZIDE 20-25 MG PO TABS: 1.0000 | ORAL_TABLET | Freq: Every day | ORAL | 3 refills | Status: DC

## 2019-10-23 MED ORDER — OMEPRAZOLE 40 MG PO CPDR: 40.0000 mg | DELAYED_RELEASE_CAPSULE | Freq: Every day | ORAL | 3 refills | Status: DC

## 2019-10-23 MED ORDER — LORAZEPAM 2 MG PO TABS: 2.0000 mg | ORAL_TABLET | Freq: Three times a day (TID) | ORAL | 1 refills | Status: DC | PRN

## 2019-10-23 MED ORDER — ATORVASTATIN CALCIUM 80 MG PO TABS: 80.0000 mg | ORAL_TABLET | Freq: Every day | ORAL | 3 refills | Status: DC

## 2019-10-23 MED ORDER — METFORMIN HCL 1000 MG PO TABS: ORAL_TABLET | ORAL | 3 refills | Status: DC

## 2019-10-23 MED ORDER — SAXAGLIPTIN HCL 5 MG PO TABS: 5.0000 mg | ORAL_TABLET | Freq: Every day | ORAL | 3 refills | Status: DC

## 2019-10-23 NOTE — Telephone Encounter (Signed)
I sent in refills.

## 2019-10-23 NOTE — Telephone Encounter (Signed)
Please advise. Pt was seen in January for an acute visit, she has not had a physical in 2 years. has a physical scheduled this month. Ok for a temporary supply?

## 2019-10-23 NOTE — Addendum Note (Signed)
Addended by: Gershon Crane A on: 10/23/2019 04:28 PM   Modules accepted: Orders

## 2019-10-24 NOTE — Telephone Encounter (Signed)
I sent in all her refills

## 2019-11-12 ENCOUNTER — Other Ambulatory Visit: Payer: Self-pay

## 2019-11-13 ENCOUNTER — Ambulatory Visit (INDEPENDENT_AMBULATORY_CARE_PROVIDER_SITE_OTHER): Payer: No Typology Code available for payment source | Admitting: Family Medicine

## 2019-11-13 ENCOUNTER — Encounter: Payer: Self-pay | Admitting: Family Medicine

## 2019-11-13 VITALS — BP 122/64 | HR 82 | Temp 97.2°F | Ht 67.75 in | Wt 167.2 lb

## 2019-11-13 DIAGNOSIS — E119 Type 2 diabetes mellitus without complications: Secondary | ICD-10-CM | POA: Diagnosis not present

## 2019-11-13 DIAGNOSIS — Z0000 Encounter for general adult medical examination without abnormal findings: Secondary | ICD-10-CM

## 2019-11-13 LAB — CBC WITH DIFFERENTIAL/PLATELET
Basophils Absolute: 0 K/uL (ref 0.0–0.1)
Basophils Relative: 0.9 % (ref 0.0–3.0)
Eosinophils Absolute: 0.1 K/uL (ref 0.0–0.7)
Eosinophils Relative: 2.3 % (ref 0.0–5.0)
HCT: 33.4 % — ABNORMAL LOW (ref 36.0–46.0)
Hemoglobin: 11.2 g/dL — ABNORMAL LOW (ref 12.0–15.0)
Lymphocytes Relative: 39.1 % (ref 12.0–46.0)
Lymphs Abs: 1.4 K/uL (ref 0.7–4.0)
MCHC: 33.5 g/dL (ref 30.0–36.0)
MCV: 86.2 fl (ref 78.0–100.0)
Monocytes Absolute: 0.3 K/uL (ref 0.1–1.0)
Monocytes Relative: 8.2 % (ref 3.0–12.0)
Neutro Abs: 1.8 K/uL (ref 1.4–7.7)
Neutrophils Relative %: 49.5 % (ref 43.0–77.0)
Platelets: 192 K/uL (ref 150.0–400.0)
RBC: 3.88 Mil/uL (ref 3.87–5.11)
RDW: 15.7 % — ABNORMAL HIGH (ref 11.5–15.5)
WBC: 3.6 K/uL — ABNORMAL LOW (ref 4.0–10.5)

## 2019-11-13 LAB — HEPATIC FUNCTION PANEL
ALT: 16 U/L (ref 0–35)
AST: 16 U/L (ref 0–37)
Albumin: 4.6 g/dL (ref 3.5–5.2)
Alkaline Phosphatase: 64 U/L (ref 39–117)
Bilirubin, Direct: 0.1 mg/dL (ref 0.0–0.3)
Total Bilirubin: 0.4 mg/dL (ref 0.2–1.2)
Total Protein: 6.9 g/dL (ref 6.0–8.3)

## 2019-11-13 LAB — HEMOGLOBIN A1C: Hgb A1c MFr Bld: 6.2 % (ref 4.6–6.5)

## 2019-11-13 LAB — BASIC METABOLIC PANEL
BUN: 11 mg/dL (ref 6–23)
CO2: 33 mEq/L — ABNORMAL HIGH (ref 19–32)
Calcium: 9.6 mg/dL (ref 8.4–10.5)
Chloride: 100 mEq/L (ref 96–112)
Creatinine, Ser: 0.88 mg/dL (ref 0.40–1.20)
GFR: 80.54 mL/min (ref >60.00)
Glucose, Bld: 121 mg/dL — ABNORMAL HIGH (ref 70–99)
Potassium: 3.7 mEq/L (ref 3.5–5.1)
Sodium: 139 mEq/L (ref 135–145)

## 2019-11-13 LAB — LIPID PANEL
Cholesterol: 204 mg/dL — ABNORMAL HIGH (ref 0–200)
HDL: 63.4 mg/dL (ref >39.00)
LDL Cholesterol: 123 mg/dL — ABNORMAL HIGH (ref 0–99)
NonHDL: 140.94
Total CHOL/HDL Ratio: 3
Triglycerides: 89 mg/dL (ref 0.0–149.0)
VLDL: 17.8 mg/dL (ref 0.0–40.0)

## 2019-11-13 LAB — TSH: TSH: 2.05 u[IU]/mL (ref 0.35–4.50)

## 2019-11-13 LAB — T4, FREE: Free T4: 1.05 ng/dL (ref 0.60–1.60)

## 2019-11-13 LAB — T3, FREE: T3, Free: 3 pg/mL (ref 2.3–4.2)

## 2019-11-13 NOTE — Progress Notes (Signed)
   Subjective:    Patient ID: Brittany [name], female    DOB: [birth year], 54 y.o.   MRN: [id number]  HPI Here for a well exam. She feels great. She has ben working with an agency in [place] called [facility] to help her diabetes. She has made big changes in her diet and she hass lost some weight. She is wearing a CGM monitor and her glucoses have been well managed. She stopped taking Onglyza 3 weeks ago.    Review of Systems  Constitutional: Negative.   HENT: Negative.   Eyes: Negative.   Respiratory: Negative.   Cardiovascular: Negative.   Gastrointestinal: Negative.   Genitourinary: Negative for decreased urine volume, difficulty urinating, dyspareunia, dysuria, enuresis, flank pain, frequency, hematuria, pelvic pain and urgency.  Musculoskeletal: Negative.   Skin: Negative.   Neurological: Negative.   Psychiatric/Behavioral: Negative.        Objective:   Physical Exam Constitutional:      General: She is not in acute distress.    Appearance: She is well-developed.  HENT:     Head: Normocephalic and atraumatic.     Right Ear: External ear normal.     Left Ear: External ear normal.     Nose: Nose normal.     Mouth/Throat:     Pharynx: No oropharyngeal exudate.  Eyes:     General: No scleral icterus.    Conjunctiva/sclera: Conjunctivae normal.     Pupils: Pupils are equal, round, and reactive to light.  Neck:     Thyroid: No thyromegaly.     Vascular: No JVD.  Cardiovascular:     Rate and Rhythm: Normal rate and regular rhythm.     Heart sounds: Normal heart sounds. No murmur heard.  No friction rub. No gallop.   Pulmonary:     Effort: Pulmonary effort is normal. No respiratory distress.     Breath sounds: Normal breath sounds. No wheezing or rales.  Chest:     Chest wall: No tenderness.  Abdominal:     General: Bowel sounds are normal. There is no distension.     Palpations: Abdomen is soft. There is no mass.     Tenderness: There is no abdominal  tenderness. There is no guarding or rebound.  Musculoskeletal:        General: No tenderness. Normal range of motion.     Cervical back: Normal range of motion and neck supple.  Lymphadenopathy:     Cervical: No cervical adenopathy.  Skin:    General: Skin is warm and dry.     Findings: No erythema or rash.  Neurological:     Mental Status: She is alert and oriented to person, place, and time.     Cranial Nerves: No cranial nerve deficit.     Motor: No abnormal muscle tone.     Coordination: Coordination normal.     Deep Tendon Reflexes: Reflexes are normal and symmetric. Reflexes normal.  Psychiatric:        Behavior: Behavior normal.        Thought Content: Thought content normal.        Judgment: Judgment normal.           Assessment & Plan:  Well exam. We discussed diet and exercise. Get fasting labs including an A1c.  Gershon Crane, MD

## 2019-11-22 ENCOUNTER — Encounter: Payer: Self-pay | Admitting: Family Medicine

## 2019-11-22 MED ORDER — ROSUVASTATIN CALCIUM 40 MG PO TABS: 40.0000 mg | ORAL_TABLET | Freq: Every day | ORAL | 3 refills | Status: DC

## 2019-11-22 NOTE — Telephone Encounter (Signed)
Yes let's try Crestor instead (it may be a little more potent). Stop the Lipitor and call in Crestor 40 mg daily, #90 with 3 rf. Recheck labs in 90 days

## 2019-11-27 ENCOUNTER — Encounter: Payer: Self-pay | Admitting: Orthopedic Surgery

## 2019-11-27 ENCOUNTER — Encounter: Payer: Self-pay | Admitting: Family Medicine

## 2019-11-27 NOTE — Telephone Encounter (Signed)
Ok to rf pls cla lthx

## 2019-11-28 MED ORDER — BLOOD GLUCOSE MONITOR KIT: PACK | 0 refills | Status: AC

## 2019-11-28 NOTE — Addendum Note (Signed)
Addended by: Waymon Amato R on: 11/28/2019 12:31 PM   Modules accepted: Orders

## 2019-12-02 MED ORDER — ROSUVASTATIN CALCIUM 40 MG PO TABS: 40.0000 mg | ORAL_TABLET | Freq: Every day | ORAL | 3 refills | Status: DC

## 2019-12-02 MED ORDER — METFORMIN HCL ER (MOD) 1000 MG PO TB24: 1000.0000 mg | ORAL_TABLET | Freq: Two times a day (BID) | ORAL | 3 refills | Status: DC

## 2019-12-02 NOTE — Addendum Note (Signed)
Addended by: Gershon Crane A on: 12/02/2019 12:47 PM   Modules accepted: Orders

## 2019-12-02 NOTE — Telephone Encounter (Signed)
I sent in Crestor and Metformin ER to [facility]

## 2020-01-07 LAB — HM DIABETES EYE EXAM

## 2020-02-10 ENCOUNTER — Encounter: Payer: Self-pay | Admitting: Family Medicine

## 2020-02-11 ENCOUNTER — Encounter: Payer: Self-pay | Admitting: Family Medicine

## 2020-03-20 ENCOUNTER — Other Ambulatory Visit: Payer: Self-pay

## 2020-03-20 MED ORDER — METFORMIN HCL ER (MOD) 1000 MG PO TB24: 1000.0000 mg | ORAL_TABLET | Freq: Two times a day (BID) | ORAL | 2 refills | Status: DC

## 2020-04-30 ENCOUNTER — Encounter: Payer: Self-pay | Admitting: Family Medicine

## 2020-05-01 MED ORDER — METFORMIN HCL 1000 MG PO TABS: 1000.0000 mg | ORAL_TABLET | Freq: Two times a day (BID) | ORAL | 3 refills | Status: DC

## 2020-05-01 NOTE — Telephone Encounter (Signed)
Done

## 2020-05-18 ENCOUNTER — Other Ambulatory Visit: Payer: Self-pay | Admitting: Family Medicine

## 2020-06-15 ENCOUNTER — Encounter: Payer: Self-pay | Admitting: Family Medicine

## 2020-06-15 ENCOUNTER — Other Ambulatory Visit: Payer: Self-pay

## 2020-06-16 MED ORDER — LORAZEPAM 2 MG PO TABS: 2.0000 mg | ORAL_TABLET | Freq: Three times a day (TID) | ORAL | 1 refills | Status: DC | PRN

## 2020-06-16 MED ORDER — OMEPRAZOLE 40 MG PO CPDR: 40.0000 mg | DELAYED_RELEASE_CAPSULE | Freq: Every day | ORAL | 3 refills | Status: DC

## 2020-06-16 MED ORDER — ROSUVASTATIN CALCIUM 40 MG PO TABS: 40.0000 mg | ORAL_TABLET | Freq: Every day | ORAL | 3 refills | Status: DC

## 2020-06-16 NOTE — Telephone Encounter (Signed)
Done

## 2020-08-16 ENCOUNTER — Other Ambulatory Visit: Payer: Self-pay | Admitting: Family Medicine

## 2020-08-17 NOTE — Telephone Encounter (Signed)
Pt needs appointment for further refills, Rx for Omeprazole is not due for refill.

## 2020-08-21 LAB — HM MAMMOGRAPHY

## 2020-08-26 ENCOUNTER — Encounter: Payer: Self-pay | Admitting: Family Medicine

## 2020-11-13 ENCOUNTER — Other Ambulatory Visit: Payer: Self-pay

## 2020-11-13 ENCOUNTER — Encounter: Payer: Self-pay | Admitting: Family Medicine

## 2020-11-13 ENCOUNTER — Ambulatory Visit (INDEPENDENT_AMBULATORY_CARE_PROVIDER_SITE_OTHER): Payer: No Typology Code available for payment source | Admitting: Family Medicine

## 2020-11-13 VITALS — BP 98/78 | HR 85 | Temp 98.3°F | Ht 67.5 in | Wt 150.4 lb

## 2020-11-13 DIAGNOSIS — H409 Unspecified glaucoma: Secondary | ICD-10-CM

## 2020-11-13 DIAGNOSIS — Z0000 Encounter for general adult medical examination without abnormal findings: Secondary | ICD-10-CM | POA: Diagnosis not present

## 2020-11-13 LAB — LIPID PANEL
Cholesterol: 290 mg/dL — ABNORMAL HIGH (ref 0–200)
HDL: 69.8 mg/dL (ref >39.00)
LDL Cholesterol: 200 mg/dL — ABNORMAL HIGH (ref 0–99)
NonHDL: 219.92
Total CHOL/HDL Ratio: 4
Triglycerides: 100 mg/dL (ref 0.0–149.0)
VLDL: 20 mg/dL (ref 0.0–40.0)

## 2020-11-13 LAB — CBC WITH DIFFERENTIAL/PLATELET
Basophils Absolute: 0 10*3/uL (ref 0.0–0.1)
Basophils Relative: 0.8 % (ref 0.0–3.0)
Eosinophils Absolute: 0.1 10*3/uL (ref 0.0–0.7)
Eosinophils Relative: 2.8 % (ref 0.0–5.0)
HCT: 35.5 % — ABNORMAL LOW (ref 36.0–46.0)
Hemoglobin: 11.9 g/dL — ABNORMAL LOW (ref 12.0–15.0)
Lymphocytes Relative: 44.7 % (ref 12.0–46.0)
Lymphs Abs: 1.6 10*3/uL (ref 0.7–4.0)
MCHC: 33.6 g/dL (ref 30.0–36.0)
MCV: 88 fl (ref 78.0–100.0)
Monocytes Absolute: 0.4 10*3/uL (ref 0.1–1.0)
Monocytes Relative: 11 % (ref 3.0–12.0)
Neutro Abs: 1.5 10*3/uL (ref 1.4–7.7)
Neutrophils Relative %: 40.7 % — ABNORMAL LOW (ref 43.0–77.0)
Platelets: 208 10*3/uL (ref 150.0–400.0)
RBC: 4.03 Mil/uL (ref 3.87–5.11)
RDW: 15.3 % (ref 11.5–15.5)
WBC: 3.6 10*3/uL — ABNORMAL LOW (ref 4.0–10.5)

## 2020-11-13 LAB — TSH: TSH: 2.59 u[IU]/mL (ref 0.35–4.50)

## 2020-11-13 LAB — BASIC METABOLIC PANEL
BUN: 17 mg/dL (ref 6–23)
CO2: 30 mEq/L (ref 19–32)
Calcium: 10.5 mg/dL (ref 8.4–10.5)
Chloride: 98 mEq/L (ref 96–112)
Creatinine, Ser: 1.04 mg/dL (ref 0.40–1.20)
GFR: 60.03 mL/min (ref >60.00)
Glucose, Bld: 112 mg/dL — ABNORMAL HIGH (ref 70–99)
Potassium: 4.2 mEq/L (ref 3.5–5.1)
Sodium: 137 mEq/L (ref 135–145)

## 2020-11-13 LAB — HEPATIC FUNCTION PANEL
ALT: 13 U/L (ref 0–35)
AST: 13 U/L (ref 0–37)
Albumin: 4.7 g/dL (ref 3.5–5.2)
Alkaline Phosphatase: 47 U/L (ref 39–117)
Bilirubin, Direct: 0.1 mg/dL (ref 0.0–0.3)
Total Bilirubin: 0.6 mg/dL (ref 0.2–1.2)
Total Protein: 7.2 g/dL (ref 6.0–8.3)

## 2020-11-13 LAB — HEMOGLOBIN A1C: Hgb A1c MFr Bld: 6.1 % (ref 4.6–6.5)

## 2020-11-13 LAB — T4, FREE: Free T4: 0.95 ng/dL (ref 0.60–1.60)

## 2020-11-13 LAB — T3, FREE: T3, Free: 3.6 pg/mL (ref 2.3–4.2)

## 2020-11-13 MED ORDER — LISINOPRIL-HYDROCHLOROTHIAZIDE 10-12.5 MG PO TABS: 1.0000 | ORAL_TABLET | Freq: Every day | ORAL | 3 refills | Status: DC

## 2020-11-13 NOTE — Addendum Note (Signed)
Addended by: Kandra Nicolas on: 11/13/2020 08:43 AM   Modules accepted: Orders

## 2020-11-13 NOTE — Progress Notes (Signed)
   Subjective:    Patient ID: Amayia Ciano, female    DOB: Apr 05, 1966, 55 y.o.   MRN: [id number]  HPI Here for a well exam. She feels well except for occasional constipation. She drinks plenty of water every day. She was recently diagnosed with glaucoma.    Review of Systems  Constitutional: Negative.   HENT: Negative.    Eyes: Negative.   Respiratory: Negative.    Cardiovascular: Negative.   Gastrointestinal: Negative.   Genitourinary:  Negative for decreased urine volume, difficulty urinating, dyspareunia, dysuria, enuresis, flank pain, frequency, hematuria, pelvic pain and urgency.  Musculoskeletal: Negative.   Skin: Negative.   Neurological: Negative.  Negative for headaches.  Psychiatric/Behavioral: Negative.        Objective:   Physical Exam Constitutional:      General: She is not in acute distress.    Appearance: Normal appearance. She is well-developed.  HENT:     Head: Normocephalic and atraumatic.     Right Ear: External ear normal.     Left Ear: External ear normal.     Nose: Nose normal.     Mouth/Throat:     Pharynx: No oropharyngeal exudate.  Eyes:     General: No scleral icterus.    Conjunctiva/sclera: Conjunctivae normal.     Pupils: Pupils are equal, round, and reactive to light.  Neck:     Thyroid: No thyromegaly.     Vascular: No JVD.  Cardiovascular:     Rate and Rhythm: Normal rate and regular rhythm.     Heart sounds: Normal heart sounds. No murmur heard.   No friction rub. No gallop.  Pulmonary:     Effort: Pulmonary effort is normal. No respiratory distress.     Breath sounds: Normal breath sounds. No wheezing or rales.  Chest:     Chest wall: No tenderness.  Abdominal:     General: Bowel sounds are normal. There is no distension.     Palpations: Abdomen is soft. There is no mass.     Tenderness: There is no abdominal tenderness. There is no guarding or rebound.  Musculoskeletal:        General: No tenderness. Normal range of  motion.     Cervical back: Normal range of motion and neck supple.  Lymphadenopathy:     Cervical: No cervical adenopathy.  Skin:    General: Skin is warm and dry.     Findings: No erythema or rash.  Neurological:     Mental Status: She is alert and oriented to person, place, and time.     Cranial Nerves: No cranial nerve deficit.     Motor: No abnormal muscle tone.     Coordination: Coordination normal.     Deep Tendon Reflexes: Reflexes are normal and symmetric. Reflexes normal.  Psychiatric:        Behavior: Behavior normal.        Thought Content: Thought content normal.        Judgment: Judgment normal.          Assessment & Plan:  Well exam. We discussed diet and exercise. Get fasting labs. She has lost some weight and her BP has come down, so we will decrease the Lisinopril HCT to 10-12.5 daily.  Gershon Crane, MD

## 2020-11-18 ENCOUNTER — Other Ambulatory Visit: Payer: Self-pay

## 2020-11-18 MED ORDER — EZETIMIBE 10 MG PO TABS: 10.0000 mg | ORAL_TABLET | Freq: Every day | ORAL | 3 refills | Status: DC

## 2020-11-19 ENCOUNTER — Other Ambulatory Visit: Payer: Self-pay

## 2020-11-19 NOTE — Telephone Encounter (Signed)
If she is only going to take one medication for the cholesterol, the Rosuvastatin is by far the better one to take. Tell to take this EVERY day and we can check levels in 90 days. Cancel the Zetia please

## 2020-11-29 ENCOUNTER — Other Ambulatory Visit: Payer: Self-pay | Admitting: Family Medicine

## 2021-02-17 ENCOUNTER — Encounter: Payer: Self-pay | Admitting: Family Medicine

## 2021-02-17 ENCOUNTER — Other Ambulatory Visit: Payer: Self-pay

## 2021-02-17 ENCOUNTER — Ambulatory Visit (INDEPENDENT_AMBULATORY_CARE_PROVIDER_SITE_OTHER): Payer: No Typology Code available for payment source | Admitting: Family Medicine

## 2021-02-17 VITALS — BP 108/78 | HR 93 | Temp 98.3°F | Wt 150.0 lb

## 2021-02-17 DIAGNOSIS — M940 Chondrocostal junction syndrome [Tietze]: Secondary | ICD-10-CM | POA: Diagnosis not present

## 2021-02-17 DIAGNOSIS — E119 Type 2 diabetes mellitus without complications: Secondary | ICD-10-CM

## 2021-02-17 DIAGNOSIS — E782 Mixed hyperlipidemia: Secondary | ICD-10-CM | POA: Diagnosis not present

## 2021-02-17 MED ORDER — DICLOFENAC SODIUM 75 MG PO TBEC: 75.0000 mg | DELAYED_RELEASE_TABLET | Freq: Two times a day (BID) | ORAL | 5 refills | Status: DC | PRN

## 2021-02-17 NOTE — Progress Notes (Signed)
   Subjective:    Patient ID: Brittany [name], female    DOB: [birth year], 55 y.o.   MRN: [id number]  HPI Here for 3 months of an intermittent pain in the left upper chest. This is annoying but not severe. No hx of trauma. It is sharp and only lasts a few seconds. It is not related to exertion, and she has no SOB.    Review of Systems  Constitutional: Negative.   Respiratory: Negative.    Cardiovascular:  Positive for chest pain. Negative for palpitations and leg swelling.      Objective:   Physical Exam Constitutional:      General: She is not in acute distress.    Appearance: Normal appearance.  Cardiovascular:     Rate and Rhythm: Normal rate and regular rhythm.     Pulses: Normal pulses.     Heart sounds: Normal heart sounds.  Pulmonary:     Effort: Pulmonary effort is normal.     Breath sounds: Normal breath sounds.     Comments: She is mildly tender in the left upper chest just inferior to the clavicle  Neurological:     Mental Status: She is alert.          Assessment & Plan:  Costochondritis. We discussed the nature of this. She will treat it with Diclofenac as needed.  Gershon Crane, MD

## 2021-03-01 ENCOUNTER — Ambulatory Visit: Payer: No Typology Code available for payment source | Admitting: Family Medicine

## 2021-03-31 ENCOUNTER — Encounter: Payer: Self-pay | Admitting: Family Medicine

## 2021-05-27 ENCOUNTER — Other Ambulatory Visit: Payer: Self-pay | Admitting: Family Medicine

## 2021-06-01 ENCOUNTER — Other Ambulatory Visit: Payer: Self-pay | Admitting: Family Medicine

## 2021-06-02 NOTE — Telephone Encounter (Signed)
Last OV- 02/17/21 Last refill- 06/16/20--270 tabs, 1 refills  No future office visit scheduled.   Can this patient receive a refill?

## 2021-07-02 ENCOUNTER — Other Ambulatory Visit: Payer: Self-pay | Admitting: Family Medicine

## 2021-07-02 DIAGNOSIS — E119 Type 2 diabetes mellitus without complications: Secondary | ICD-10-CM

## 2021-08-17 ENCOUNTER — Telehealth: Payer: Self-pay

## 2021-08-23 ENCOUNTER — Other Ambulatory Visit: Payer: Self-pay

## 2021-08-23 MED ORDER — ONETOUCH VERIO VI STRP: ORAL_STRIP | 2 refills | Status: DC

## 2021-08-23 NOTE — Telephone Encounter (Signed)
Message sent to Dr Fry for review ?

## 2021-09-26 ENCOUNTER — Other Ambulatory Visit: Payer: Self-pay | Admitting: Family Medicine

## 2021-09-26 DIAGNOSIS — E119 Type 2 diabetes mellitus without complications: Secondary | ICD-10-CM

## 2021-09-27 NOTE — Telephone Encounter (Signed)
Pt need appointment for further refills 

## 2021-10-24 ENCOUNTER — Other Ambulatory Visit: Payer: Self-pay | Admitting: Family Medicine

## 2021-10-24 DIAGNOSIS — I10 Essential (primary) hypertension: Secondary | ICD-10-CM

## 2022-01-11 ENCOUNTER — Telehealth: Payer: Self-pay | Admitting: Family Medicine

## 2022-01-11 MED ORDER — LORAZEPAM 2 MG PO TABS: 2.0000 mg | ORAL_TABLET | Freq: Three times a day (TID) | ORAL | 0 refills | Status: DC | PRN

## 2022-01-11 NOTE — Telephone Encounter (Addendum)
Last OV-02/17/21 Last refill-06/02/21--90 tabs, 5 refills  No future OV scheduled.

## 2022-01-11 NOTE — Telephone Encounter (Signed)
Requesting refill, 90d supply of LORazepam (ATIVAN) 2 MG tablet   [facility] [id number] - [place], [place] - [address] Phone:  [phone]  Fax:  [phone]

## 2022-01-11 NOTE — Addendum Note (Signed)
Addended by: Gershon Crane A on: 01/11/2022 05:19 PM   Modules accepted: Orders

## 2022-01-11 NOTE — Telephone Encounter (Signed)
I sent in a 30 day supply only. After that she will need to see me for a well exam, labs, etc

## 2022-01-12 NOTE — Telephone Encounter (Signed)
Appointment for CPE scheduled today for 03/18/22.

## 2022-01-22 ENCOUNTER — Other Ambulatory Visit: Payer: Self-pay | Admitting: Family Medicine

## 2022-01-22 DIAGNOSIS — I10 Essential (primary) hypertension: Secondary | ICD-10-CM

## 2022-03-18 ENCOUNTER — Encounter: Payer: Self-pay | Admitting: Family Medicine

## 2022-03-18 ENCOUNTER — Ambulatory Visit (INDEPENDENT_AMBULATORY_CARE_PROVIDER_SITE_OTHER): Payer: No Typology Code available for payment source | Admitting: Family Medicine

## 2022-03-18 VITALS — BP 98/76 | HR 93 | Temp 97.6°F | Ht 67.75 in | Wt 139.0 lb

## 2022-03-18 DIAGNOSIS — R3915 Urgency of urination: Secondary | ICD-10-CM | POA: Diagnosis not present

## 2022-03-18 DIAGNOSIS — Z0000 Encounter for general adult medical examination without abnormal findings: Secondary | ICD-10-CM | POA: Diagnosis not present

## 2022-03-18 LAB — CBC WITH DIFFERENTIAL/PLATELET
Basophils Absolute: 0 10*3/uL (ref 0.0–0.1)
Basophils Relative: 0.8 % (ref 0.0–3.0)
Eosinophils Absolute: 0.1 10*3/uL (ref 0.0–0.7)
Eosinophils Relative: 3.8 % (ref 0.0–5.0)
HCT: 35 % — ABNORMAL LOW (ref 36.0–46.0)
Hemoglobin: 11.8 g/dL — ABNORMAL LOW (ref 12.0–15.0)
Lymphocytes Relative: 46.4 % — ABNORMAL HIGH (ref 12.0–46.0)
Lymphs Abs: 1.6 10*3/uL (ref 0.7–4.0)
MCHC: 33.7 g/dL (ref 30.0–36.0)
MCV: 89.5 fl (ref 78.0–100.0)
Monocytes Absolute: 0.3 10*3/uL (ref 0.1–1.0)
Monocytes Relative: 8.8 % (ref 3.0–12.0)
Neutro Abs: 1.4 10*3/uL (ref 1.4–7.7)
Neutrophils Relative %: 40.2 % — ABNORMAL LOW (ref 43.0–77.0)
Platelets: 236 10*3/uL (ref 150.0–400.0)
RBC: 3.91 Mil/uL (ref 3.87–5.11)
RDW: 13.5 % (ref 11.5–15.5)
WBC: 3.4 10*3/uL — ABNORMAL LOW (ref 4.0–10.5)

## 2022-03-18 LAB — HEPATIC FUNCTION PANEL
ALT: 9 U/L (ref 0–35)
AST: 15 U/L (ref 0–37)
Albumin: 4.6 g/dL (ref 3.5–5.2)
Alkaline Phosphatase: 80 U/L (ref 39–117)
Bilirubin, Direct: 0.1 mg/dL (ref 0.0–0.3)
Total Bilirubin: 0.5 mg/dL (ref 0.2–1.2)
Total Protein: 7.5 g/dL (ref 6.0–8.3)

## 2022-03-18 LAB — POC URINALSYSI DIPSTICK (AUTOMATED)
Bilirubin, UA: NEGATIVE
Blood, UA: NEGATIVE
Glucose, UA: NEGATIVE
Ketones, UA: NEGATIVE
Leukocytes, UA: NEGATIVE
Nitrite, UA: NEGATIVE
Protein, UA: NEGATIVE
Spec Grav, UA: 1.01 (ref 1.010–1.025)
Urobilinogen, UA: 0.2 E.U./dL
pH, UA: 6 (ref 5.0–8.0)

## 2022-03-18 LAB — LIPID PANEL
Cholesterol: 280 mg/dL — ABNORMAL HIGH (ref 0–200)
HDL: 60.8 mg/dL (ref >39.00)
LDL Cholesterol: 202 mg/dL — ABNORMAL HIGH (ref 0–99)
NonHDL: 219
Total CHOL/HDL Ratio: 5
Triglycerides: 86 mg/dL (ref 0.0–149.0)
VLDL: 17.2 mg/dL (ref 0.0–40.0)

## 2022-03-18 LAB — HEMOGLOBIN A1C: Hgb A1c MFr Bld: 5.6 % (ref 4.6–6.5)

## 2022-03-18 LAB — BASIC METABOLIC PANEL
BUN: 17 mg/dL (ref 6–23)
CO2: 30 mEq/L (ref 19–32)
Calcium: 10.2 mg/dL (ref 8.4–10.5)
Chloride: 97 mEq/L (ref 96–112)
Creatinine, Ser: 1.1 mg/dL (ref 0.40–1.20)
GFR: 55.6 mL/min — ABNORMAL LOW (ref >60.00)
Glucose, Bld: 97 mg/dL (ref 70–99)
Potassium: 4.3 mEq/L (ref 3.5–5.1)
Sodium: 134 mEq/L — ABNORMAL LOW (ref 135–145)

## 2022-03-18 LAB — TSH: TSH: 1.94 u[IU]/mL (ref 0.35–5.50)

## 2022-03-18 MED ORDER — LORAZEPAM 2 MG PO TABS: 2.0000 mg | ORAL_TABLET | Freq: Three times a day (TID) | ORAL | 1 refills | Status: DC | PRN

## 2022-03-18 NOTE — Progress Notes (Signed)
Subjective:    Patient ID: Brittany [name], female    DOB: [birth year], 56 y.o.   MRN: [id number]  HPI Here for a well exam. She has been feeling well except for 2 issues. First she has had urinary urgency and foul smelling urine for the past 2 weeks. No burning or fever. Also 3 nights ago when she tried to sit on the toilet in the dark she missed the toilet and fell on there tailbone onto the floor. She has been stiff and sore since then, but the pain has not been severe. Otherwise she continues to exercise and watch her diet, such that she has lost 11 lbs in the past year. Her BP has been slowly coming down.    Review of Systems  Constitutional: Negative.   HENT: Negative.    Eyes: Negative.   Respiratory: Negative.    Cardiovascular: Negative.   Gastrointestinal: Negative.   Genitourinary:  Positive for urgency. Negative for decreased urine volume, difficulty urinating, dyspareunia, dysuria, enuresis, flank pain, frequency, hematuria and pelvic pain.  Musculoskeletal:  Positive for arthralgias.  Skin: Negative.   Neurological: Negative.  Negative for headaches.  Psychiatric/Behavioral: Negative.         Objective:   Physical Exam Constitutional:      General: She is not in acute distress.    Appearance: Normal appearance. She is well-developed.     Comments: She walks slowly and stiffly   HENT:     Head: Normocephalic and atraumatic.     Right Ear: External ear normal.     Left Ear: External ear normal.     Nose: Nose normal.     Mouth/Throat:     Pharynx: No oropharyngeal exudate.  Eyes:     General: No scleral icterus.    Conjunctiva/sclera: Conjunctivae normal.     Pupils: Pupils are equal, round, and reactive to light.  Neck:     Thyroid: No thyromegaly.     Vascular: No JVD.  Cardiovascular:     Rate and Rhythm: Normal rate and regular rhythm.     Heart sounds: Normal heart sounds. No murmur heard.    No friction rub. No gallop.  Pulmonary:     Effort:  Pulmonary effort is normal. No respiratory distress.     Breath sounds: Normal breath sounds. No wheezing or rales.  Chest:     Chest wall: No tenderness.  Abdominal:     General: Bowel sounds are normal. There is no distension.     Palpations: Abdomen is soft. There is no mass.     Tenderness: There is no abdominal tenderness. There is no guarding or rebound.  Musculoskeletal:        General: Normal range of motion.     Cervical back: Normal range of motion and neck supple.     Comments: Mildly tender over the sacrum and coccyx   Lymphadenopathy:     Cervical: No cervical adenopathy.  Skin:    General: Skin is warm and dry.     Findings: No erythema or rash.  Neurological:     Mental Status: She is alert and oriented to person, place, and time.     Cranial Nerves: No cranial nerve deficit.     Motor: No abnormal muscle tone.     Coordination: Coordination normal.     Deep Tendon Reflexes: Reflexes are normal and symmetric. Reflexes normal.  Psychiatric:        Behavior: Behavior normal.  Thought Content: Thought content normal.        Judgment: Judgment normal.           Assessment & Plan:  Well exam. We discussed diet and exercise. Get fasting labs. We will refer her back to Dr. Collene Mares for another colonoscopy. The UA today is clear, so we will send the sample for a culture. I did advice her to drink plenty of water every day. Since she has lost the weight, her BP has been well controlled, so we will stop the Lotensin HCT. She has a bruised sacrum but this should heal over the next few weeks.  Alysia Penna, MD

## 2022-03-20 LAB — URINE CULTURE
MICRO NUMBER:: 14110922
Result:: NO GROWTH
SPECIMEN QUALITY:: ADEQUATE

## 2022-03-23 ENCOUNTER — Other Ambulatory Visit: Payer: Self-pay

## 2022-03-23 DIAGNOSIS — E119 Type 2 diabetes mellitus without complications: Secondary | ICD-10-CM

## 2022-03-23 MED ORDER — EZETIMIBE 10 MG PO TABS: 10.0000 mg | ORAL_TABLET | Freq: Every day | ORAL | 3 refills | Status: DC

## 2022-03-23 MED ORDER — METFORMIN HCL 1000 MG PO TABS: 500.0000 mg | ORAL_TABLET | Freq: Two times a day (BID) | ORAL | 0 refills | Status: DC

## 2022-04-04 ENCOUNTER — Encounter: Payer: Self-pay | Admitting: Family Medicine

## 2022-04-05 NOTE — Telephone Encounter (Signed)
Try Voltaren gel (OTC)

## 2022-04-22 ENCOUNTER — Other Ambulatory Visit: Payer: Self-pay | Admitting: Family Medicine

## 2022-04-22 DIAGNOSIS — I10 Essential (primary) hypertension: Secondary | ICD-10-CM

## 2022-06-24 ENCOUNTER — Telehealth: Payer: Self-pay

## 2022-06-24 NOTE — Telephone Encounter (Signed)
What is the message?

## 2022-06-28 MED ORDER — OMEPRAZOLE 40 MG PO CPDR: 40.0000 mg | DELAYED_RELEASE_CAPSULE | Freq: Every day | ORAL | 3 refills | Status: AC

## 2022-06-28 NOTE — Telephone Encounter (Signed)
Pt is aware.  

## 2022-06-28 NOTE — Telephone Encounter (Signed)
Done

## 2022-06-28 NOTE — Telephone Encounter (Signed)
Sorry for incomplete note, pt previously took Omeprazole 40 mg for her GERD but at her last visit she stated that she did not feel like she needed this medication anymore. Pt is now wanting to get back on Omeprazole 40 mg daily, requests for a new Rx [facility]

## 2022-09-05 ENCOUNTER — Other Ambulatory Visit: Payer: Self-pay | Admitting: Family Medicine

## 2022-09-05 DIAGNOSIS — E119 Type 2 diabetes mellitus without complications: Secondary | ICD-10-CM

## 2022-09-20 ENCOUNTER — Other Ambulatory Visit: Payer: Self-pay | Admitting: Family Medicine

## 2022-09-21 NOTE — Telephone Encounter (Signed)
Pt LOV was on 03/18/22 Last refill was done on 03/15/23 Please advise

## 2022-10-17 ENCOUNTER — Other Ambulatory Visit: Payer: Self-pay | Admitting: Family Medicine

## 2022-10-28 ENCOUNTER — Other Ambulatory Visit: Payer: Self-pay

## 2022-10-28 ENCOUNTER — Telehealth: Payer: Self-pay | Admitting: Family Medicine

## 2022-10-28 MED ORDER — LISINOPRIL-HYDROCHLOROTHIAZIDE 20-25 MG PO TABS: 1.0000 | ORAL_TABLET | Freq: Every day | ORAL | 2 refills | Status: DC

## 2022-10-28 NOTE — Telephone Encounter (Signed)
Call in Lisinopril HCT 20-25 daily, #30 with 2 rf. Report back in 3 weeks

## 2022-10-28 NOTE — Telephone Encounter (Signed)
Please advise per last note in 02/2022:  "Since she has lost the weight, her BP has been well controlled, so we will stop the Lotensin HCT "

## 2022-10-28 NOTE — Telephone Encounter (Signed)
Pt called to inform MD that lately she has been experiencing Elevated BP  Pt has been off Lisinopril - since October 2023  BP 158/90 - yesterday BP 149/88 - today  Pt would like to resume Rx and monitor BP.  Please advise.

## 2022-10-28 NOTE — Telephone Encounter (Signed)
Patient notified of update  and verbalized understanding. 

## 2022-11-02 ENCOUNTER — Encounter: Payer: Self-pay | Admitting: Family Medicine

## 2022-11-02 ENCOUNTER — Ambulatory Visit: Payer: No Typology Code available for payment source | Admitting: Family Medicine

## 2022-11-02 VITALS — BP 164/90 | HR 93 | Temp 97.8°F | Wt 144.8 lb

## 2022-11-02 DIAGNOSIS — R0981 Nasal congestion: Secondary | ICD-10-CM

## 2022-11-02 DIAGNOSIS — R110 Nausea: Secondary | ICD-10-CM | POA: Diagnosis not present

## 2022-11-02 DIAGNOSIS — U071 COVID-19: Secondary | ICD-10-CM | POA: Diagnosis not present

## 2022-11-02 DIAGNOSIS — H65191 Other acute nonsuppurative otitis media, right ear: Secondary | ICD-10-CM | POA: Diagnosis not present

## 2022-11-02 DIAGNOSIS — H9203 Otalgia, bilateral: Secondary | ICD-10-CM | POA: Diagnosis not present

## 2022-11-02 DIAGNOSIS — H65192 Other acute nonsuppurative otitis media, left ear: Secondary | ICD-10-CM

## 2022-11-02 DIAGNOSIS — I10 Essential (primary) hypertension: Secondary | ICD-10-CM

## 2022-11-02 LAB — POC COVID19 BINAXNOW: SARS Coronavirus 2 Ag: POSITIVE — AB

## 2022-11-02 MED ORDER — MOLNUPIRAVIR EUA 200MG CAPSULE
4.0000 | ORAL_CAPSULE | Freq: Two times a day (BID) | ORAL | 0 refills | Status: AC
Start: 2022-11-02 — End: 2022-11-07

## 2022-11-02 MED ORDER — AMOXICILLIN-POT CLAVULANATE 500-125 MG PO TABS
1.0000 | ORAL_TABLET | Freq: Two times a day (BID) | ORAL | 0 refills | Status: DC
Start: 2022-11-02 — End: 2022-11-12

## 2022-11-02 NOTE — Progress Notes (Signed)
Established Patient Office Visit   Subjective  Patient ID: Brittany [name], female    DOB: 03/26/1966  Age: 57 y.o. MRN: [id number]  Chief Complaint  Patient presents with   Medical Management of Chronic Issues    Nausea, head pressure, bilateral ear pain, last 3 days for dizziness. Last Thursday for head pressure when went to eye doctor, 135/100 this morning BS 100. Leg pain, comes and goes, sits for 12 hours at her job, thinks it may be circulation . Also has gas.    Patient is a 57 year old female followed by Dr. Clent Ridges and seen for acute concern.  Patient endorses elevated BP at eye doctor, 158/? last Thursday.  Patient restarted lisinopril-hydrochlorothiazide 10-12.5 mg daily on Sunday.  Previously on meds but it was discontinued in October 2023 as BP was well-controlled.  Patient also developed lightheadedness, dizziness, nausea, chills, and head congestion, nasal drainage, ear pain 3 days ago on Sunday.  Denies sick contacts, works from home.  Tried Flonase this morning which helped with drainage.  Patient also trying an acid.  Has Rx for omeprazole but takes as needed instead of daily.  Also tried Tylenol.  Took BP meds this morning.      ROS Negative unless stated above    Objective:     BP (!) 164/90 (BP Location: Right Arm, Patient Position: Sitting, Cuff Size: Normal)   Pulse 93   Temp 97.8 F (36.6 C) (Oral)   Wt 144 lb 12.8 oz (65.7 kg)   SpO2 98%   BMI 22.18 kg/m    Physical Exam Constitutional:      General: She is not in acute distress.    Appearance: Normal appearance.  HENT:     Head: Normocephalic and atraumatic.     Right Ear: A middle ear effusion is present. Tympanic membrane is erythematous and bulging.     Left Ear: A middle ear effusion is present. Tympanic membrane is bulging.     Ears:     Comments: Bilateral ear effusion.  Mild erythema in the base of the right TM.    Nose: Nose normal.     Mouth/Throat:     Mouth: Mucous membranes are  moist.  Cardiovascular:     Rate and Rhythm: Normal rate and regular rhythm.     Heart sounds: Normal heart sounds. No murmur heard.    No gallop.  Pulmonary:     Effort: Pulmonary effort is normal. No respiratory distress.     Breath sounds: Normal breath sounds. No wheezing, rhonchi or rales.  Skin:    General: Skin is warm and dry.  Neurological:     Mental Status: She is alert and oriented to person, place, and time.      Results for orders placed or performed in visit on 11/02/22  POC COVID-19  Result Value Ref Range   SARS Coronavirus 2 Ag Positive (A) Negative      Assessment & Plan:  COVID-19 virus infection -     molnupiravir EUA; Take 4 capsules (800 mg total) by mouth 2 (two) times daily for 5 days.  Dispense: 40 capsule; Refill: 0  Acute otitis media with effusion of right ear -     Amoxicillin-Pot Clavulanate; Take 1 tablet by mouth in the morning and at bedtime for 7 days.  Dispense: 14 tablet; Refill: 0  Acute effusion of left ear  Nasal congestion -     POC COVID-19 BinaxNow  Otalgia of both ears -  POC COVID-19 BinaxNow  Essential hypertension  Nausea -     POC COVID-19 BinaxNow  COVID testing positive this visit.  Rx for molnupiravir sent to pharmacy.  If not covered by insurance patient will not pick up.  Start ABX for right AOM with effusion.  Continue Flonase for effusion and nasal drainage.  Tylenol as needed.  OTC cough/cold medications for people with high blood pressure.  Patient advised to continue taking lisinopril-HCTZ 10-12.5 mg daily.  Lifestyle modifications.  Check BP at home and keep a log to bring with you to clinic.  Given strict precautions  Return if symptoms worsen or fail to improve.   Deeann Saint, MD

## 2022-11-02 NOTE — Patient Instructions (Signed)
A prescription for your blood pressure medicine lisinopril-hydrochlorothiazide 20-25 mg daily was already sent to pharmacy on 10/28/2022.  You can pick this up and start taking it.

## 2022-11-07 ENCOUNTER — Telehealth: Payer: Self-pay | Admitting: Family Medicine

## 2022-11-07 NOTE — Telephone Encounter (Signed)
Pt called in and stated that she would like a call back to discuss options to help with her nausea. She said she has been taking over the counter medication such as pepto bismo, however that does not seem to be helping.

## 2022-11-08 ENCOUNTER — Encounter (HOSPITAL_BASED_OUTPATIENT_CLINIC_OR_DEPARTMENT_OTHER): Payer: Self-pay | Admitting: *Deleted

## 2022-11-08 ENCOUNTER — Other Ambulatory Visit: Payer: Self-pay

## 2022-11-08 ENCOUNTER — Inpatient Hospital Stay (HOSPITAL_BASED_OUTPATIENT_CLINIC_OR_DEPARTMENT_OTHER)
Admission: EM | Admit: 2022-11-08 | Discharge: 2022-11-12 | DRG: 178 | Disposition: A | Discharge status: Home / Self Care | Payer: No Typology Code available for payment source | Attending: Internal Medicine | Admitting: Internal Medicine

## 2022-11-08 DIAGNOSIS — Z7984 Long term (current) use of oral hypoglycemic drugs: Secondary | ICD-10-CM

## 2022-11-08 DIAGNOSIS — E871 Hypo-osmolality and hyponatremia: Secondary | ICD-10-CM | POA: Diagnosis present

## 2022-11-08 DIAGNOSIS — E861 Hypovolemia: Secondary | ICD-10-CM | POA: Diagnosis present

## 2022-11-08 DIAGNOSIS — Z7982 Long term (current) use of aspirin: Secondary | ICD-10-CM

## 2022-11-08 DIAGNOSIS — H409 Unspecified glaucoma: Secondary | ICD-10-CM | POA: Diagnosis present

## 2022-11-08 DIAGNOSIS — R112 Nausea with vomiting, unspecified: Secondary | ICD-10-CM | POA: Diagnosis present

## 2022-11-08 DIAGNOSIS — Z79899 Other long term (current) drug therapy: Secondary | ICD-10-CM

## 2022-11-08 DIAGNOSIS — I10 Essential (primary) hypertension: Secondary | ICD-10-CM | POA: Diagnosis present

## 2022-11-08 DIAGNOSIS — F419 Anxiety disorder, unspecified: Secondary | ICD-10-CM | POA: Diagnosis present

## 2022-11-08 DIAGNOSIS — Z8261 Family history of arthritis: Secondary | ICD-10-CM

## 2022-11-08 DIAGNOSIS — E785 Hyperlipidemia, unspecified: Secondary | ICD-10-CM | POA: Diagnosis present

## 2022-11-08 DIAGNOSIS — Z8249 Family history of ischemic heart disease and other diseases of the circulatory system: Secondary | ICD-10-CM

## 2022-11-08 DIAGNOSIS — U071 COVID-19: Secondary | ICD-10-CM | POA: Diagnosis not present

## 2022-11-08 DIAGNOSIS — Z833 Family history of diabetes mellitus: Secondary | ICD-10-CM

## 2022-11-08 DIAGNOSIS — Z823 Family history of stroke: Secondary | ICD-10-CM

## 2022-11-08 DIAGNOSIS — T502X5A Adverse effect of carbonic-anhydrase inhibitors, benzothiadiazides and other diuretics, initial encounter: Secondary | ICD-10-CM | POA: Diagnosis present

## 2022-11-08 DIAGNOSIS — M940 Chondrocostal junction syndrome [Tietze]: Secondary | ICD-10-CM | POA: Diagnosis present

## 2022-11-08 DIAGNOSIS — Z8616 Personal history of COVID-19: Secondary | ICD-10-CM

## 2022-11-08 DIAGNOSIS — E119 Type 2 diabetes mellitus without complications: Secondary | ICD-10-CM | POA: Diagnosis present

## 2022-11-08 DIAGNOSIS — D638 Anemia in other chronic diseases classified elsewhere: Secondary | ICD-10-CM | POA: Diagnosis present

## 2022-11-08 DIAGNOSIS — K219 Gastro-esophageal reflux disease without esophagitis: Secondary | ICD-10-CM | POA: Diagnosis present

## 2022-11-08 DIAGNOSIS — D72819 Decreased white blood cell count, unspecified: Secondary | ICD-10-CM | POA: Diagnosis present

## 2022-11-08 LAB — BASIC METABOLIC PANEL
Anion gap: 10 (ref 5–15)
BUN: 9 mg/dL (ref 6–20)
CO2: 21 mmol/L — ABNORMAL LOW (ref 22–32)
Calcium: 8.4 mg/dL — ABNORMAL LOW (ref 8.9–10.3)
Chloride: 80 mmol/L — ABNORMAL LOW (ref 98–111)
Creatinine, Ser: 0.79 mg/dL (ref 0.44–1.00)
GFR, Estimated: >60 mL/min (ref >60)
Glucose, Bld: 136 mg/dL — ABNORMAL HIGH (ref 70–99)
Potassium: 3.5 mmol/L (ref 3.5–5.1)
Sodium: 111 mmol/L — CL (ref 135–145)

## 2022-11-08 LAB — TROPONIN I (HIGH SENSITIVITY): Troponin I (High Sensitivity): 2 ng/L (ref <18)

## 2022-11-08 LAB — COMPREHENSIVE METABOLIC PANEL
ALT: 20 U/L (ref 0–44)
AST: 33 U/L (ref 15–41)
Albumin: 4.7 g/dL (ref 3.5–5.0)
Alkaline Phosphatase: 47 U/L (ref 38–126)
Anion gap: 13 (ref 5–15)
BUN: 9 mg/dL (ref 6–20)
CO2: 19 mmol/L — ABNORMAL LOW (ref 22–32)
Calcium: 9.1 mg/dL (ref 8.9–10.3)
Chloride: 75 mmol/L — ABNORMAL LOW (ref 98–111)
Creatinine, Ser: 0.87 mg/dL (ref 0.44–1.00)
GFR, Estimated: >60 mL/min (ref >60)
Glucose, Bld: 163 mg/dL — ABNORMAL HIGH (ref 70–99)
Potassium: 4.1 mmol/L (ref 3.5–5.1)
Sodium: 107 mmol/L — CL (ref 135–145)
Total Bilirubin: 0.6 mg/dL (ref 0.3–1.2)
Total Protein: 7.5 g/dL (ref 6.5–8.1)

## 2022-11-08 LAB — CBC
HCT: 32.6 % — ABNORMAL LOW (ref 36.0–46.0)
Hemoglobin: 12.1 g/dL (ref 12.0–15.0)
MCH: 29.9 pg (ref 26.0–34.0)
MCHC: 37.1 g/dL — ABNORMAL HIGH (ref 30.0–36.0)
MCV: 80.5 fL (ref 80.0–100.0)
Platelets: 225 10*3/uL (ref 150–400)
RBC: 4.05 MIL/uL (ref 3.87–5.11)
RDW: 12.1 % (ref 11.5–15.5)
WBC: 3.8 10*3/uL — ABNORMAL LOW (ref 4.0–10.5)
nRBC: 0 % (ref 0.0–0.2)

## 2022-11-08 LAB — LIPASE, BLOOD: Lipase: 141 U/L — ABNORMAL HIGH (ref 11–51)

## 2022-11-08 LAB — MAGNESIUM: Magnesium: 1.4 mg/dL — ABNORMAL LOW (ref 1.7–2.4)

## 2022-11-08 MED ORDER — SODIUM CHLORIDE 0.9 % IV SOLN: Freq: Once | INTRAVENOUS | Status: AC

## 2022-11-08 MED ORDER — ONDANSETRON HCL 4 MG/2ML IJ SOLN
4.0000 mg | Freq: Once | INTRAMUSCULAR | Status: AC
  Administered 2022-11-08: 4 mg via INTRAVENOUS

## 2022-11-08 MED ORDER — SODIUM CHLORIDE 0.9 % IV BOLUS
1000.0000 mL | Freq: Once | INTRAVENOUS | Status: AC
  Administered 2022-11-08: 1000 mL via INTRAVENOUS

## 2022-11-08 MED ORDER — ONDANSETRON HCL 8 MG PO TABS: 8.0000 mg | ORAL_TABLET | Freq: Four times a day (QID) | ORAL | 0 refills | Status: DC | PRN

## 2022-11-08 MED ORDER — MAGNESIUM SULFATE 2 GM/50ML IV SOLN
2.0000 g | Freq: Once | INTRAVENOUS | Status: AC
  Administered 2022-11-09: 2 g via INTRAVENOUS
  Filled 2022-11-08: qty 50

## 2022-11-08 NOTE — Telephone Encounter (Signed)
Spoke with pt aware to pick up Rx for Zofran from her pharmacy

## 2022-11-08 NOTE — Telephone Encounter (Signed)
Pt called in stating she was returning a call.

## 2022-11-08 NOTE — ED Triage Notes (Signed)
Pt reports testing covid + last Wednesday (6/12). Finished cycle of molnupiravir yesterday. C/o chest pressure/indigestion, dizziness, emesis x 2 days. Has been taking multiple medications:  Amoxicillen (bilateral ear infection), Omeprazole, and Pepto without improvement. Pt was unable to pick up prescription of Zofran today.

## 2022-11-08 NOTE — ED Provider Notes (Signed)
[place] EMERGENCY DEPARTMENT AT [facility] Provider Note   CSN: [id number] Arrival date & time: 11/08/22  2155     History {Add pertinent medical, surgical, social history, OB history to HPI:1} Chief Complaint  Patient presents with   Emesis   Chest Pain    Brittany [name] is a 57 y.o. female.  She has a history of diabetes.  She said she was diagnosed with COVID about a week ago and was placed on antiviral medicine along with amoxicillin for ear infection.  For the last 2 days she has had nausea and vomiting, few loose stools.  Burning in chest and reflux.  Has tried omeprazole and Pepto-Bismol without improvement.  Was called in a prescription for Zofran today but was unable to pick it up.  Symptoms continue and was getting anxious about them so decided to come here for further evaluation.  The history is provided by the patient.  Emesis Severity:  Moderate Duration:  2 days Timing:  Sporadic Progression:  Unchanged Chronicity:  New Relieved by:  Nothing Worsened by:  Liquids Ineffective treatments:  Antiemetics Associated symptoms: diarrhea        Home Medications Prior to Admission medications   Medication Sig Start Date End Date Taking? Authorizing Provider  amoxicillin-clavulanate (AUGMENTIN) 500-125 MG tablet Take 1 tablet by mouth in the morning and at bedtime for 7 days. 11/02/22 11/09/22  Deeann Saint, MD  aspirin 81 MG chewable tablet Chew 1 tablet (81 mg total) by mouth daily. 02/03/14   Arby Barrette, MD  blood glucose meter kit and supplies KIT Dispense based on patient and insurance preference. Use up to four times daily as directed. (FOR ICD-9 250.00, 250.01). 11/28/19   Nelwyn Salisbury, MD  diclofenac (VOLTAREN) 75 MG EC tablet Take 1 tablet (75 mg total) by mouth 2 (two) times daily as needed for moderate pain. 02/17/21   Nelwyn Salisbury, MD  ezetimibe (ZETIA) 10 MG tablet Take 1 tablet (10 mg total) by mouth daily. 03/23/22   Nelwyn Salisbury,  MD  glucose blood [facility] VERIO) test strip TEST ONCE DAILY 10/18/22   Nelwyn Salisbury, MD  lisinopril-hydrochlorothiazide (ZESTORETIC) 20-25 MG tablet Take 1 tablet by mouth daily. 10/28/22   Nelwyn Salisbury, MD  LORazepam (ATIVAN) 2 MG tablet TAKE 1 TABLET BY MOUTH EVERY 8  HOURS AS NEEDED FOR ANXIETY 09/21/22   Nelwyn Salisbury, MD  metFORMIN (GLUCOPHAGE) 1000 MG tablet TAKE ONE-HALF TABLET BY MOUTH  TWICE DAILY WITH MEALS 09/05/22   Nelwyn Salisbury, MD  omeprazole (PRILOSEC) 40 MG capsule Take 1 capsule (40 mg total) by mouth daily. 06/28/22   Nelwyn Salisbury, MD  ondansetron (ZOFRAN) 8 MG tablet Take 1 tablet (8 mg total) by mouth every 6 (six) hours as needed for nausea or vomiting. 11/08/22   Nelwyn Salisbury, MD  [facility] LANCETS FINE MISC TEST ONCE A DAY 06/26/17   Nelwyn Salisbury, MD      Allergies    Patient has no known allergies.    Review of Systems   Review of Systems  Cardiovascular:  Positive for chest pain.  Gastrointestinal:  Positive for diarrhea, nausea and vomiting.    Physical Exam Updated Vital Signs BP (!) 192/104 (BP Location: Right Arm)   Pulse 89   Temp 98.4 F (36.9 C) (Oral)   Resp 16   SpO2 100%  Physical Exam Vitals and nursing note reviewed.  Constitutional:      General: She  is not in acute distress.    Appearance: She is well-developed.  HENT:     Head: Normocephalic and atraumatic.  Eyes:     Conjunctiva/sclera: Conjunctivae normal.  Cardiovascular:     Rate and Rhythm: Normal rate and regular rhythm.     Heart sounds: No murmur heard. Pulmonary:     Effort: Pulmonary effort is normal. No respiratory distress.     Breath sounds: Normal breath sounds.  Abdominal:     Palpations: Abdomen is soft.     Tenderness: There is no abdominal tenderness.  Musculoskeletal:        General: No swelling.     Cervical back: Neck supple.  Skin:    General: Skin is warm and dry.     Capillary Refill: Capillary refill takes less than 2 seconds.  Neurological:      General: No focal deficit present.     Mental Status: She is alert.     ED Results / Procedures / Treatments   Labs (all labs ordered are listed, but only abnormal results are displayed) Labs Reviewed  LIPASE, BLOOD  COMPREHENSIVE METABOLIC PANEL  CBC  URINALYSIS, ROUTINE W REFLEX MICROSCOPIC  TROPONIN I (HIGH SENSITIVITY)    EKG None  Radiology No results found.  Procedures Procedures  {Document cardiac monitor, telemetry assessment procedure when appropriate:1}  Medications Ordered in ED Medications  sodium chloride 0.9 % bolus 1,000 mL (has no administration in time range)  ondansetron (ZOFRAN) injection 4 mg (has no administration in time range)    ED Course/ Medical Decision Making/ A&P   {   Click here for ABCD2, HEART and other calculatorsREFRESH Note before signing :1}                          Medical Decision Making Amount and/or Complexity of Data Reviewed Labs: ordered.  Risk Prescription drug management.   This patient complains of ***; this involves an extensive number of treatment Options and is a complaint that carries with it a high risk of complications and morbidity. The differential includes ***  I ordered, reviewed and interpreted labs, which included *** I ordered medication *** and reviewed PMP when indicated. I ordered imaging studies which included *** and I independently    visualized and interpreted imaging which showed *** Additional history obtained from *** Previous records obtained and reviewed *** I consulted *** and discussed lab and imaging findings and discussed disposition.  Cardiac monitoring reviewed, *** Social determinants considered, *** Critical Interventions: ***  After the interventions stated above, I reevaluated the patient and found *** Admission and further testing considered, ***   {Document critical care time when appropriate:1} {Document review of labs and clinical decision tools ie heart score,  Chads2Vasc2 etc:1}  {Document your independent review of radiology images, and any outside records:1} {Document your discussion with family members, caretakers, and with consultants:1} {Document social determinants of health affecting pt's care:1} {Document your decision making why or why not admission, treatments were needed:1} Final Clinical Impression(s) / ED Diagnoses Final diagnoses:  None    Rx / DC Orders ED Discharge Orders     None

## 2022-11-08 NOTE — Telephone Encounter (Signed)
Call in Zofran 8 mg every 6 hours as needed for nausea, #60 with no refills. If this does not help, she should make an OV

## 2022-11-09 DIAGNOSIS — E119 Type 2 diabetes mellitus without complications: Secondary | ICD-10-CM | POA: Diagnosis present

## 2022-11-09 DIAGNOSIS — R112 Nausea with vomiting, unspecified: Secondary | ICD-10-CM | POA: Diagnosis present

## 2022-11-09 DIAGNOSIS — H409 Unspecified glaucoma: Secondary | ICD-10-CM | POA: Diagnosis present

## 2022-11-09 DIAGNOSIS — E871 Hypo-osmolality and hyponatremia: Secondary | ICD-10-CM | POA: Diagnosis present

## 2022-11-09 DIAGNOSIS — Z7982 Long term (current) use of aspirin: Secondary | ICD-10-CM | POA: Diagnosis not present

## 2022-11-09 DIAGNOSIS — Z833 Family history of diabetes mellitus: Secondary | ICD-10-CM | POA: Diagnosis not present

## 2022-11-09 DIAGNOSIS — Z823 Family history of stroke: Secondary | ICD-10-CM | POA: Diagnosis not present

## 2022-11-09 DIAGNOSIS — U071 COVID-19: Secondary | ICD-10-CM | POA: Diagnosis present

## 2022-11-09 DIAGNOSIS — F419 Anxiety disorder, unspecified: Secondary | ICD-10-CM | POA: Diagnosis present

## 2022-11-09 DIAGNOSIS — Z79899 Other long term (current) drug therapy: Secondary | ICD-10-CM | POA: Diagnosis not present

## 2022-11-09 DIAGNOSIS — E861 Hypovolemia: Secondary | ICD-10-CM | POA: Diagnosis present

## 2022-11-09 DIAGNOSIS — D72819 Decreased white blood cell count, unspecified: Secondary | ICD-10-CM | POA: Diagnosis present

## 2022-11-09 DIAGNOSIS — E1159 Type 2 diabetes mellitus with other circulatory complications: Secondary | ICD-10-CM | POA: Diagnosis not present

## 2022-11-09 DIAGNOSIS — Z7984 Long term (current) use of oral hypoglycemic drugs: Secondary | ICD-10-CM | POA: Diagnosis not present

## 2022-11-09 DIAGNOSIS — E785 Hyperlipidemia, unspecified: Secondary | ICD-10-CM | POA: Diagnosis present

## 2022-11-09 DIAGNOSIS — T502X5A Adverse effect of carbonic-anhydrase inhibitors, benzothiadiazides and other diuretics, initial encounter: Secondary | ICD-10-CM | POA: Diagnosis present

## 2022-11-09 DIAGNOSIS — Z8616 Personal history of COVID-19: Secondary | ICD-10-CM | POA: Diagnosis not present

## 2022-11-09 DIAGNOSIS — I10 Essential (primary) hypertension: Secondary | ICD-10-CM | POA: Diagnosis present

## 2022-11-09 DIAGNOSIS — K219 Gastro-esophageal reflux disease without esophagitis: Secondary | ICD-10-CM | POA: Diagnosis present

## 2022-11-09 DIAGNOSIS — M940 Chondrocostal junction syndrome [Tietze]: Secondary | ICD-10-CM | POA: Diagnosis present

## 2022-11-09 DIAGNOSIS — D638 Anemia in other chronic diseases classified elsewhere: Secondary | ICD-10-CM | POA: Diagnosis present

## 2022-11-09 DIAGNOSIS — I152 Hypertension secondary to endocrine disorders: Secondary | ICD-10-CM | POA: Diagnosis not present

## 2022-11-09 DIAGNOSIS — Z8261 Family history of arthritis: Secondary | ICD-10-CM | POA: Diagnosis not present

## 2022-11-09 DIAGNOSIS — Z8249 Family history of ischemic heart disease and other diseases of the circulatory system: Secondary | ICD-10-CM | POA: Diagnosis not present

## 2022-11-09 LAB — CBC
HCT: 31.9 % — ABNORMAL LOW (ref 36.0–46.0)
Hemoglobin: 11.8 g/dL — ABNORMAL LOW (ref 12.0–15.0)
MCH: 29.7 pg (ref 26.0–34.0)
MCHC: 37 g/dL — ABNORMAL HIGH (ref 30.0–36.0)
MCV: 80.4 fL (ref 80.0–100.0)
Platelets: 213 10*3/uL (ref 150–400)
RBC: 3.97 MIL/uL (ref 3.87–5.11)
RDW: 11.9 % (ref 11.5–15.5)
WBC: 3.9 10*3/uL — ABNORMAL LOW (ref 4.0–10.5)
nRBC: 0 % (ref 0.0–0.2)

## 2022-11-09 LAB — URINALYSIS, ROUTINE W REFLEX MICROSCOPIC
Bacteria, UA: NONE SEEN
Bilirubin Urine: NEGATIVE
Glucose, UA: NEGATIVE mg/dL
Hgb urine dipstick: NEGATIVE
Ketones, ur: NEGATIVE mg/dL
Leukocytes,Ua: NEGATIVE
Nitrite: NEGATIVE
Protein, ur: NEGATIVE mg/dL
Specific Gravity, Urine: 1.009 (ref 1.005–1.030)
pH: 7.5 (ref 5.0–8.0)

## 2022-11-09 LAB — MRSA NEXT GEN BY PCR, NASAL: MRSA by PCR Next Gen: NOT DETECTED

## 2022-11-09 LAB — GLUCOSE, CAPILLARY
Glucose-Capillary: 101 mg/dL — ABNORMAL HIGH (ref 70–99)
Glucose-Capillary: 106 mg/dL — ABNORMAL HIGH (ref 70–99)
Glucose-Capillary: 119 mg/dL — ABNORMAL HIGH (ref 70–99)
Glucose-Capillary: 122 mg/dL — ABNORMAL HIGH (ref 70–99)
Glucose-Capillary: 124 mg/dL — ABNORMAL HIGH (ref 70–99)
Glucose-Capillary: 136 mg/dL — ABNORMAL HIGH (ref 70–99)
Glucose-Capillary: 153 mg/dL — ABNORMAL HIGH (ref 70–99)

## 2022-11-09 LAB — HEMOGLOBIN A1C
Hgb A1c MFr Bld: 5 % (ref 4.8–5.6)
Mean Plasma Glucose: 96.8 mg/dL

## 2022-11-09 LAB — MAGNESIUM: Magnesium: 2.1 mg/dL (ref 1.7–2.4)

## 2022-11-09 LAB — OSMOLALITY: Osmolality: 230 mOsm/kg — CL (ref 275–295)

## 2022-11-09 LAB — PHOSPHORUS: Phosphorus: 1.7 mg/dL — ABNORMAL LOW (ref 2.5–4.6)

## 2022-11-09 LAB — SODIUM
Sodium: 118 mmol/L — CL (ref 135–145)
Sodium: 123 mmol/L — ABNORMAL LOW (ref 135–145)
Sodium: 124 mmol/L — ABNORMAL LOW (ref 135–145)
Sodium: 124 mmol/L — ABNORMAL LOW (ref 135–145)
Sodium: 124 mmol/L — ABNORMAL LOW (ref 135–145)

## 2022-11-09 LAB — BASIC METABOLIC PANEL
Anion gap: 11 (ref 5–15)
BUN: 7 mg/dL (ref 6–20)
CO2: 21 mmol/L — ABNORMAL LOW (ref 22–32)
Calcium: 8.6 mg/dL — ABNORMAL LOW (ref 8.9–10.3)
Chloride: 79 mmol/L — ABNORMAL LOW (ref 98–111)
Creatinine, Ser: 0.86 mg/dL (ref 0.44–1.00)
GFR, Estimated: >60 mL/min (ref >60)
Glucose, Bld: 121 mg/dL — ABNORMAL HIGH (ref 70–99)
Potassium: 3.6 mmol/L (ref 3.5–5.1)
Sodium: 111 mmol/L — CL (ref 135–145)

## 2022-11-09 LAB — OSMOLALITY, URINE: Osmolality, Ur: 324 mOsm/kg (ref 300–900)

## 2022-11-09 LAB — SODIUM, URINE, RANDOM: Sodium, Ur: 96 mmol/L

## 2022-11-09 LAB — HIV ANTIBODY (ROUTINE TESTING W REFLEX): HIV Screen 4th Generation wRfx: NONREACTIVE

## 2022-11-09 LAB — TROPONIN I (HIGH SENSITIVITY): Troponin I (High Sensitivity): 4 ng/L (ref <18)

## 2022-11-09 MED ORDER — ONDANSETRON HCL 4 MG/2ML IJ SOLN
4.0000 mg | Freq: Four times a day (QID) | INTRAMUSCULAR | Status: DC | PRN
  Administered 2022-11-09 – 2022-11-10 (×2): 4 mg via INTRAVENOUS
  Filled 2022-11-09 (×2): qty 2

## 2022-11-09 MED ORDER — HYDRALAZINE HCL 20 MG/ML IJ SOLN: 10.0000 mg | INTRAMUSCULAR | Status: DC | PRN

## 2022-11-09 MED ORDER — POTASSIUM PHOSPHATES 15 MMOLE/5ML IV SOLN
30.0000 mmol | Freq: Once | INTRAVENOUS | Status: AC
  Administered 2022-11-09: 30 mmol via INTRAVENOUS
  Filled 2022-11-09: qty 10

## 2022-11-09 MED ORDER — INSULIN ASPART 100 UNIT/ML IJ SOLN
0.0000 [IU] | INTRAMUSCULAR | Status: DC
  Administered 2022-11-09 (×3): 2 [IU] via SUBCUTANEOUS

## 2022-11-09 MED ORDER — SODIUM CHLORIDE 0.9 % IV SOLN: INTRAVENOUS | Status: DC

## 2022-11-09 MED ORDER — ACETAMINOPHEN 325 MG PO TABS
650.0000 mg | ORAL_TABLET | ORAL | Status: DC | PRN
  Administered 2022-11-09 – 2022-11-10 (×3): 650 mg via ORAL
  Filled 2022-11-09 (×4): qty 2

## 2022-11-09 MED ORDER — DOCUSATE SODIUM 100 MG PO CAPS: 100.0000 mg | ORAL_CAPSULE | Freq: Two times a day (BID) | ORAL | Status: DC | PRN

## 2022-11-09 MED ORDER — POLYETHYLENE GLYCOL 3350 17 G PO PACK: 17.0000 g | PACK | Freq: Every day | ORAL | Status: DC | PRN

## 2022-11-09 MED ORDER — MELATONIN 3 MG PO TABS
3.0000 mg | ORAL_TABLET | Freq: Once | ORAL | Status: AC
  Administered 2022-11-09: 3 mg via ORAL
  Filled 2022-11-09: qty 1

## 2022-11-09 MED ORDER — HEPARIN SODIUM (PORCINE) 5000 UNIT/ML IJ SOLN
5000.0000 [IU] | Freq: Three times a day (TID) | INTRAMUSCULAR | Status: DC
  Administered 2022-11-09: 5000 [IU] via SUBCUTANEOUS
  Filled 2022-11-09: qty 1

## 2022-11-09 MED ORDER — HYDRALAZINE HCL 20 MG/ML IJ SOLN
10.0000 mg | INTRAMUSCULAR | Status: DC | PRN
  Filled 2022-11-09: qty 1

## 2022-11-09 MED ORDER — CHLORHEXIDINE GLUCONATE CLOTH 2 % EX PADS
6.0000 | MEDICATED_PAD | Freq: Every day | CUTANEOUS | Status: DC
  Administered 2022-11-09 – 2022-11-11 (×3): 6 via TOPICAL

## 2022-11-09 MED ORDER — SODIUM CHLORIDE 3 % IV BOLUS
100.0000 mL | Freq: Once | INTRAVENOUS | Status: AC
  Administered 2022-11-09: 100 mL via INTRAVENOUS
  Filled 2022-11-09: qty 500

## 2022-11-09 MED ORDER — ORAL CARE MOUTH RINSE: 15.0000 mL | OROMUCOSAL | Status: DC | PRN

## 2022-11-09 MED ORDER — ASPIRIN 81 MG PO CHEW
81.0000 mg | CHEWABLE_TABLET | Freq: Every day | ORAL | Status: DC
  Administered 2022-11-09 – 2022-11-12 (×4): 81 mg via ORAL
  Filled 2022-11-09 (×3): qty 1

## 2022-11-09 MED ORDER — LORAZEPAM 1 MG PO TABS
1.0000 mg | ORAL_TABLET | Freq: Every evening | ORAL | Status: DC | PRN
  Administered 2022-11-09 – 2022-11-11 (×3): 1 mg via ORAL
  Filled 2022-11-09 (×3): qty 1

## 2022-11-09 MED ORDER — DEXTROSE 5 % IV SOLN: INTRAVENOUS | Status: DC

## 2022-11-09 NOTE — H&P (Signed)
NAME:  Brittany [name], MRN:  [id number], DOB:  Dec 10, 1965, LOS: 0 ADMISSION DATE:  11/08/2022, CONSULTATION DATE:  11/09/22 REFERRING MD:  Shelly Bombard DB CHIEF COMPLAINT:  Nausea, Dizziness   History of Present Illness:  Brittany [name] is a 57 y.o. female who has a PMH as below. She presented to [facility] 6/18 with nausea, vomiting, dizziness, chest pressure vs indigestion x 2 - 3 days. She was diagnosed with COVID 6/12 and started on Molnupiravir. States she started taking this and experienced worsening of nausea and vomiting which was only intermittent/mild prior to starting therapy. She called PCP 6/18 and was prescribed Zofran but was not able to pick up due to severity of symptoms; therefore, went to ED. She has not had much to eat or drink in past 2 days due to not being able to keep anything down. She was also just recently started on Lisinopril-hydrochlorothiazide on  10/27/21 after being off BP meds since Oct 2023 (Lotensin).  In ED, she was found to have Na of 107. She was started on NS and repeat 1 hour later was 111. Besides nausea/vomiting, dizziness, she had not had any additional symptoms. Denies any confusion, weakness, AMS, syncope, seizures.  Due to severity of her hyponatremia, PCCM called for admission to ICU for closer monitoring.  Pertinent  Medical History:  has Diabetes mellitus without complication (HCC); Hyperlipidemia; CARPAL TUNNEL SYNDROME; Essential hypertension; GERD; Osteoarthritis; Anxiety disorder; Glaucoma; Costochondritis; and Hyponatremia on their problem list.  Significant Hospital Events: Including procedures, antibiotic start and stop dates in addition to other pertinent events   6/19 admit.  Interim History / Subjective:  Feels tired but otherwise asymptomatic. Zofran has helped with nausea/vomiting.  Objective:  Blood pressure (!) 153/107, pulse 86, temperature 98.1 F (36.7 C), temperature source Oral, resp. rate 20, SpO2 100 %.       No  intake or output data in the 24 hours ending 11/09/22 0155 There were no vitals filed for this visit.  Examination (performed from doorway with assistance of RN in room): General: Adult female, resting in bed, in NAD. Neuro: A&O x 3, no deficits. HEENT: [place]/AT. Sclerae anicteric. EOMI. Cardiovascular: RRR, no M/R/G.  Lungs: Respirations even and unlabored.  CTA bilaterally, No W/R/R Abdomen: BS x 4, soft, NT/ND.  Musculoskeletal: No gross deformities, no edema.  Skin: Intact, warm, no rashes.   Assessment & Plan:   Hyponatremia, hypotonic/hypovolemic -  2/2 nausea/vomiting and poor PO intake in setting COVID and possibly exacerbated after Molnupiravir which she was prescribed 1 week ago. Of note, she was also recently started on Lisinopril-hydrochlorothiazide daily (10/28/22). - Continue NS @ 125. - No need for hypertonic saline at this time. - Frequent Na checks q4hrs, avoid rapid overcorrection. - Stop and hold Lisinopril-hydrochlorothiazide.   Nausea and vomiting - possibly reaction to Molnupiravir. - Zofran PRN.  Hx HTN, HLD. - Hydralazine PRN for SBP > 170. - Hold home Lisinopril-hydrochlorothiazide (would not resume hydrochlorothiazide component on d/c), Zetia.  Hypomagnesemia - s/p repletion. - Follow BMP.  Hx DM. - SSI. - Hold home Metformin.  Hx COVID (diagnosed 11/02/22) and s/p course Molnupiravir. - Supportive care. - Continue isolation through 6/21 for 10 days total.  Best practice (evaluated daily):  Diet/type: Regular consistency (see orders) DVT prophylaxis: prophylactic heparin  GI prophylaxis: N/A Lines: N/A Foley:  N/A Code Status:  full code Last date of multidisciplinary goals of care discussion: None yet.  Labs   CBC: Recent Labs  Lab 11/08/22 2211  WBC  3.8*  HGB 12.1  HCT 32.6*  MCV 80.5  PLT 225    Basic Metabolic Panel: Recent Labs  Lab 11/08/22 2211 11/08/22 2307  NA 107* 111*  K 4.1 3.5  CL 75* 80*  CO2 19* 21*  GLUCOSE 163*  136*  BUN 9 9  CREATININE 0.87 0.79  CALCIUM 9.1 8.4*  MG 1.4*  --    GFR: CrCl cannot be calculated (Unknown ideal weight.). Recent Labs  Lab 11/08/22 2211  WBC 3.8*    Liver Function Tests: Recent Labs  Lab 11/08/22 2211  AST 33  ALT 20  ALKPHOS 47  BILITOT 0.6  PROT 7.5  ALBUMIN 4.7   Recent Labs  Lab 11/08/22 2211  LIPASE 141*   No results for input(s): "AMMONIA" in the last 168 hours.  ABG No results found for: "PHART", "PCO2ART", "PO2ART", "HCO3", "TCO2", "ACIDBASEDEF", "O2SAT"   Coagulation Profile: No results for input(s): "INR", "PROTIME" in the last 168 hours.  Cardiac Enzymes: No results for input(s): "CKTOTAL", "CKMB", "CKMBINDEX", "TROPONINI" in the last 168 hours.  HbA1C: Hgb A1c MFr Bld  Date/Time Value Ref Range Status  03/18/2022 09:00 AM 5.6 4.6 - 6.5 % Final    Comment:    Glycemic Control Guidelines for People with Diabetes:Non Diabetic:  <6%Goal of Therapy: <7%Additional Action Suggested:  >8%   11/13/2020 08:43 AM 6.1 4.6 - 6.5 % Final    Comment:    Glycemic Control Guidelines for People with Diabetes:Non Diabetic:  <6%Goal of Therapy: <7%Additional Action Suggested:  >8%     CBG: Recent Labs  Lab 11/09/22 0127  GLUCAP 153*    Review of Systems:   All negative; except for those that are bolded, which indicate positives.  Constitutional: weight loss, weight gain, night sweats, fevers, chills, fatigue, weakness.  HEENT: headaches, sore throat, sneezing, nasal congestion, post nasal drip, difficulty swallowing, tooth/dental problems, visual complaints, visual changes, ear aches. Neuro: difficulty with speech, weakness, numbness, ataxia, dizziness. CV:  chest pain, orthopnea, PND, swelling in lower extremities, dizziness, palpitations, syncope.  Resp: cough, hemoptysis, dyspnea, wheezing. GI: heartburn, indigestion, abdominal pain, nausea, vomiting, diarrhea, constipation, change in bowel habits, loss of appetite, hematemesis,  melena, hematochezia.  GU: dysuria, change in color of urine, urgency or frequency, flank pain, hematuria. MSK: joint pain or swelling, decreased range of motion. Psych: change in mood or affect, depression, anxiety, suicidal ideations, homicidal ideations. Skin: rash, itching, bruising.   Past Medical History:  She,  has a past medical history of Diabetes mellitus, GERD (gastroesophageal reflux disease), Glaucoma, Hyperlipidemia, and Hypertension.   Surgical History:   Past Surgical History:  Procedure Laterality Date   COLONOSCOPY  04/22/2016   per Dr. Loreta Ave, adenomatous polyps, repeat in 5 yrs    ESOPHAGOGASTRODUODENOSCOPY  04/22/2016   per Dr. Loreta Ave, normal except small hiatal hernia    VAGINAL HYSTERECTOMY     ovaries intact     Social History:   reports that she has never smoked. She has never used smokeless tobacco. She reports current alcohol use. She reports that she does not use drugs.   Family History:  Her family history includes Arthritis in an other family member; Diabetes in an other family member; Heart disease in an other family member; Hyperlipidemia in an other family member; Hypertension in an other family member; Stroke in an other family member.   Allergies No Known Allergies   Home Medications  Prior to Admission medications   Medication Sig Start Date End Date Taking? Authorizing Provider  amoxicillin-clavulanate (AUGMENTIN) 500-125 MG tablet Take 1 tablet by mouth in the morning and at bedtime for 7 days. 11/02/22 11/09/22  Deeann Saint, MD  aspirin 81 MG chewable tablet Chew 1 tablet (81 mg total) by mouth daily. 02/03/14   Arby Barrette, MD  blood glucose meter kit and supplies KIT Dispense based on patient and insurance preference. Use up to four times daily as directed. (FOR ICD-9 250.00, 250.01). 11/28/19   Nelwyn Salisbury, MD  diclofenac (VOLTAREN) 75 MG EC tablet Take 1 tablet (75 mg total) by mouth 2 (two) times daily as needed for moderate pain.  02/17/21   Nelwyn Salisbury, MD  ezetimibe (ZETIA) 10 MG tablet Take 1 tablet (10 mg total) by mouth daily. 03/23/22   Nelwyn Salisbury, MD  glucose blood [facility] VERIO) test strip TEST ONCE DAILY 10/18/22   Nelwyn Salisbury, MD  lisinopril-hydrochlorothiazide (ZESTORETIC) 20-25 MG tablet Take 1 tablet by mouth daily. 10/28/22   Nelwyn Salisbury, MD  LORazepam (ATIVAN) 2 MG tablet TAKE 1 TABLET BY MOUTH EVERY 8  HOURS AS NEEDED FOR ANXIETY 09/21/22   Nelwyn Salisbury, MD  metFORMIN (GLUCOPHAGE) 1000 MG tablet TAKE ONE-HALF TABLET BY MOUTH  TWICE DAILY WITH MEALS 09/05/22   Nelwyn Salisbury, MD  omeprazole (PRILOSEC) 40 MG capsule Take 1 capsule (40 mg total) by mouth daily. 06/28/22   Nelwyn Salisbury, MD  ondansetron (ZOFRAN) 8 MG tablet Take 1 tablet (8 mg total) by mouth every 6 (six) hours as needed for nausea or vomiting. 11/08/22   Nelwyn Salisbury, MD  [facility] LANCETS FINE MISC TEST ONCE A DAY 06/26/17   Nelwyn Salisbury, MD     Critical care time: 35 min.   Rutherford Guys, PA - [facility] [place] [facility] For pager details, please see AMION or use Epic chat  After 1900, please call ELINK for cross coverage needs 11/09/2022, 1:55 AM

## 2022-11-09 NOTE — Progress Notes (Signed)
NAME:  Brittany [name], MRN:  [id number], DOB:  05/31/1965, LOS: 0 ADMISSION DATE:  11/08/2022, CONSULTATION DATE:  11/09/22 REFERRING MD:  Shelly Bombard DB CHIEF COMPLAINT:  Nausea, Dizziness   History of Present Illness:  Brittany [name] is a 57 y.o. female who has a PMH as below. She presented to [facility] 6/18 with nausea, vomiting, dizziness, chest pressure vs indigestion x 2 - 3 days. She was diagnosed with COVID 6/12 and started on Molnupiravir. States she started taking this and experienced worsening of nausea and vomiting which was only intermittent/mild prior to starting therapy. She called PCP 6/18 and was prescribed Zofran but was not able to pick up due to severity of symptoms; therefore, went to ED. She has not had much to eat or drink in past 2 days due to not being able to keep anything down. She was also just recently started on Lisinopril-hydrochlorothiazide on  10/27/21 after being off BP meds since Oct 2023 (Lotensin).  In ED, she was found to have Na of 107. She was started on NS and repeat 1 hour later was 111. Besides nausea/vomiting, dizziness, she had not had any additional symptoms. Denies any confusion, weakness, AMS, syncope, seizures.  Due to severity of her hyponatremia, PCCM called for admission to ICU for closer monitoring.  Pertinent  Medical History:  has Diabetes mellitus without complication (HCC); Hyperlipidemia; CARPAL TUNNEL SYNDROME; Essential hypertension; GERD; Osteoarthritis; Anxiety disorder; Glaucoma; Costochondritis; and Hyponatremia on their problem list.  Significant Hospital Events: Including procedures, antibiotic start and stop dates in addition to other pertinent events   6/19 admit.  Interim History / Subjective:  She denies new complaints-- appetite is still poor due to covid.  Saline stopped this morning due to rapid rise in sodium.  Objective:  Blood pressure 138/78, pulse 77, temperature 97.8 F (36.6 C), temperature source Oral,  resp. rate 17, height 5' 7.72" (1.72 m), weight 62 kg, SpO2 100 %.        Intake/Output Summary (Last 24 hours) at 11/09/2022 1358 Last data filed at 11/09/2022 0730 Gross per 24 hour  Intake 1592.28 ml  Output 3400 ml  Net -1807.72 ml    Filed Weights   11/09/22 0500  Weight: 62 kg    Examination : General: middle aged woman lying in bed in NAD Neuro: awake, alert, answering questions appropriately, moving all extremities. HEENT: [place]/AT, eyes anicteric Cardiovascular: S1S2, RRR Lungs: breathing comfortably on RA, CTAB Abdomen: nondistended, nontender Musculoskeletal: per c/c/e  Skin: warm, dry, no rashes  Na+ 111> 118> 123   Assessment & Plan:   Hyponatremia, hypotonic & euvolemic -  2/2 nausea/vomiting and poor PO intake in setting COVID and possibly exacerbated after Molnupiravir which she was prescribed 1 week ago. Of note, she was also recently started on Lisinopril-hydrochlorothiazide daily (10/28/22). With rapid correction this is most likely due to hydrochlorothiazide -stopped saline earlier, despite this continued to rise too fast. Now starting D5w.  -serial sodiums q4h; needs ongoing ICU monitoring -would recommend against using hydrochlorothiazide in the future; ACE is likely fine  Nausea and vomiting - possibly reaction to Molnupiravir vs covid -zofran PRN -PO intake as she can tolerate  Hx HTN, HLD - hydralazine PRN - Hold home Lisinopril-hydrochlorothiazide -- wouldn't resume hydrochlorothiazide -con't zetia  Hypomagnesemia - s/p repletion hypophosphatemia -repleted, monitor  Hx DM; A1c 5.0 -hold PTA metformin -SSI PRN -goal BG 140-180  Hx COVID (diagnosed 11/02/22) and s/p course Molnupiravir -con't 10 day isolation through 6/21 - Continue isolation through  6/21 for 10 days total  Anemia -transfuse for Hb <7 or hemodynamically significant bleeding -monitor  Leukopenia, likely 2/2 acute illness with covid -monitor  Husband updated at bedside  with patient.   Best practice (evaluated daily):  Diet/type: Regular consistency (see orders) DVT prophylaxis: prophylactic heparin  GI prophylaxis: N/A Lines: N/A Foley:  N/A Code Status:  full code Last date of multidisciplinary goals of care discussion:    Labs   CBC: Recent Labs  Lab 11/08/22 2211 11/09/22 0257  WBC 3.8* 3.9*  HGB 12.1 11.8*  HCT 32.6* 31.9*  MCV 80.5 80.4  PLT 225 213     Basic Metabolic Panel: Recent Labs  Lab 11/08/22 2211 11/08/22 2307 11/09/22 0257 11/09/22 0642 11/09/22 1040  NA 107* 111* 111* 118* 123*  K 4.1 3.5 3.6  --   --   CL 75* 80* 79*  --   --   CO2 19* 21* 21*  --   --   GLUCOSE 163* 136* 121*  --   --   BUN 9 9 7   --   --   CREATININE 0.87 0.79 0.86  --   --   CALCIUM 9.1 8.4* 8.6*  --   --   MG 1.4*  --  2.1  --   --   PHOS  --   --  1.7*  --   --     GFR: Estimated Creatinine Clearance: 69.8 mL/min (by C-G formula based on SCr of 0.86 mg/dL). Recent Labs  Lab 11/08/22 2211 11/09/22 0257  WBC 3.8* 3.9*     Liver Function Tests: Recent Labs  Lab 11/08/22 2211  AST 33  ALT 20  ALKPHOS 47  BILITOT 0.6  PROT 7.5  ALBUMIN 4.7    Recent Labs  Lab 11/08/22 2211  LIPASE 141*      Critical care time:      Cc time: 25 min.  Steffanie Dunn, DO 11/09/22 2:01 PM [place] [facility]  For contact information, see Amion. If no response to pager, please call PCCM consult pager. After hours, 7PM- 7AM, please call Elink.

## 2022-11-09 NOTE — Progress Notes (Addendum)
eLink Physician-Brief Progress Note Patient Name: Brittany [name] DOB: [birth year] MRN: [id number]   Date of Service  11/09/2022  HPI/Events of Note  Serum sodium 111 despite NS gtt at 125 ml / hour.  eICU Interventions  3 % Saline 100 ml iv x 1 ordered. Q 4 hours serum sodium check ordered post 3 % saline bolus.        Brittany [name] Brittany [name] 11/09/2022, 4:22 AM

## 2022-11-09 NOTE — ED Provider Notes (Signed)
Repeat sodium 111.  Have added serum osmolality and urine studies.  Started on 75 cc/h of normal saline.  Patient and her husband were updated.  Discussed with critical care.  Patient to be admitted to the ICU.  No indication for hypertonic saline at this time.  Placed order for every 4 sodiums and urine studies.   Shon Baton, MD 11/09/22 [phone]

## 2022-11-09 NOTE — Progress Notes (Signed)
eLink Physician-Brief Progress Note Patient Name: Brittany [name] DOB: 1965/06/22 MRN: [id number]   Date of Service  11/09/2022  HPI/Events of Note  Serum sodium result pending.  eICU Interventions          Migdalia Dk 11/09/2022, 7:46 PM

## 2022-11-09 NOTE — Progress Notes (Signed)
eLink Physician-Brief Progress Note Patient Name: Brittany [name] DOB: May 05, 1966 MRN: [id number]   Date of Service  11/09/2022  HPI/Events of Note  Patient admitted with profound hyponatremia in the context of nausea and vomiting.  eICU Interventions  New Patient Evaluation.        Charisa Twitty U Abdulah Iqbal 11/09/2022, 1:42 AM

## 2022-11-09 NOTE — ED Notes (Signed)
Verbal report via phone given to Shanda Bumps, RN (38M)

## 2022-11-10 DIAGNOSIS — E871 Hypo-osmolality and hyponatremia: Secondary | ICD-10-CM | POA: Diagnosis not present

## 2022-11-10 DIAGNOSIS — I152 Hypertension secondary to endocrine disorders: Secondary | ICD-10-CM

## 2022-11-10 DIAGNOSIS — E1159 Type 2 diabetes mellitus with other circulatory complications: Secondary | ICD-10-CM

## 2022-11-10 DIAGNOSIS — U071 COVID-19: Secondary | ICD-10-CM | POA: Diagnosis not present

## 2022-11-10 LAB — COMPREHENSIVE METABOLIC PANEL
ALT: 23 U/L (ref 0–44)
AST: 24 U/L (ref 15–41)
Albumin: 3.9 g/dL (ref 3.5–5.0)
Alkaline Phosphatase: 49 U/L (ref 38–126)
Anion gap: 8 (ref 5–15)
BUN: 8 mg/dL (ref 6–20)
CO2: 22 mmol/L (ref 22–32)
Calcium: 9 mg/dL (ref 8.9–10.3)
Chloride: 93 mmol/L — ABNORMAL LOW (ref 98–111)
Creatinine, Ser: 1 mg/dL (ref 0.44–1.00)
GFR, Estimated: >60 mL/min (ref >60)
Glucose, Bld: 114 mg/dL — ABNORMAL HIGH (ref 70–99)
Potassium: 3.9 mmol/L (ref 3.5–5.1)
Sodium: 123 mmol/L — ABNORMAL LOW (ref 135–145)
Total Bilirubin: 0.5 mg/dL (ref 0.3–1.2)
Total Protein: 6.8 g/dL (ref 6.5–8.1)

## 2022-11-10 LAB — GLUCOSE, CAPILLARY
Glucose-Capillary: 106 mg/dL — ABNORMAL HIGH (ref 70–99)
Glucose-Capillary: 96 mg/dL (ref 70–99)
Glucose-Capillary: 112 mg/dL — ABNORMAL HIGH (ref 70–99)
Glucose-Capillary: 103 mg/dL — ABNORMAL HIGH (ref 70–99)
Glucose-Capillary: 126 mg/dL — ABNORMAL HIGH (ref 70–99)

## 2022-11-10 LAB — SODIUM
Sodium: 125 mmol/L — ABNORMAL LOW (ref 135–145)
Sodium: 125 mmol/L — ABNORMAL LOW (ref 135–145)
Sodium: 123 mmol/L — ABNORMAL LOW (ref 135–145)
Sodium: 122 mmol/L — ABNORMAL LOW (ref 135–145)

## 2022-11-10 LAB — PHOSPHORUS: Phosphorus: 3.6 mg/dL (ref 2.5–4.6)

## 2022-11-10 LAB — MAGNESIUM: Magnesium: 1.9 mg/dL (ref 1.7–2.4)

## 2022-11-10 MED ORDER — BUTALBITAL-APAP-CAFFEINE 50-325-40 MG PO TABS
2.0000 | ORAL_TABLET | Freq: Four times a day (QID) | ORAL | Status: DC | PRN
  Administered 2022-11-10 (×2): 2 via ORAL
  Filled 2022-11-10 (×2): qty 2

## 2022-11-10 MED ORDER — KETOROLAC TROMETHAMINE 15 MG/ML IJ SOLN
15.0000 mg | Freq: Once | INTRAMUSCULAR | Status: AC
  Administered 2022-11-10: 15 mg via INTRAVENOUS
  Filled 2022-11-10: qty 1

## 2022-11-10 MED ORDER — ENOXAPARIN SODIUM 40 MG/0.4ML IJ SOSY
40.0000 mg | PREFILLED_SYRINGE | Freq: Every day | INTRAMUSCULAR | Status: DC
  Filled 2022-11-10 (×2): qty 0.4

## 2022-11-10 MED ORDER — AMOXICILLIN-POT CLAVULANATE 875-125 MG PO TABS
1.0000 | ORAL_TABLET | Freq: Two times a day (BID) | ORAL | Status: DC
  Administered 2022-11-10 – 2022-11-12 (×4): 1 via ORAL
  Filled 2022-11-10 (×4): qty 1

## 2022-11-10 MED ORDER — FLUTICASONE PROPIONATE 50 MCG/ACT NA SUSP
1.0000 | Freq: Every day | NASAL | Status: DC
  Administered 2022-11-10 – 2022-11-12 (×3): 1 via NASAL
  Filled 2022-11-10: qty 16

## 2022-11-10 MED ORDER — INSULIN ASPART 100 UNIT/ML IJ SOLN: 0.0000 [IU] | Freq: Three times a day (TID) | INTRAMUSCULAR | Status: DC

## 2022-11-10 NOTE — Progress Notes (Addendum)
Most recent Na+ 125 with drop in d5w rate Ok to con't  D5w at 30cc/hr Con't q4h Na+   Steffanie Dunn, DO 11/10/22 2:18 PM [place] [facility]  For contact information, see Amion. If no response to pager, please call PCCM consult pager. After hours, 7PM- 7AM, please call Elink.   Repeat 125, con't d5w at 30, may decrease further if staying stable again at next recheck.  Steffanie Dunn, DO 11/10/22 3:43 PM [place] [facility]  For contact information, see Amion. If no response to pager, please call PCCM consult pager. After hours, 7PM- 7AM, please call Elink.

## 2022-11-10 NOTE — Progress Notes (Signed)
NAME:  Akiesha Dykeman, MRN:  [id number], DOB:  [birth year], LOS: 1 ADMISSION DATE:  11/08/2022, CONSULTATION DATE:  11/09/22 REFERRING MD:  Shelly Bombard DB CHIEF COMPLAINT:  Nausea, Dizziness   History of Present Illness:  Apirl Arzuaga is a 57 y.o. female who has a PMH as below. She presented to [facility] 6/18 with nausea, vomiting, dizziness, chest pressure vs indigestion x 2 - 3 days. She was diagnosed with COVID 6/12 and started on Molnupiravir. States she started taking this and experienced worsening of nausea and vomiting which was only intermittent/mild prior to starting therapy. She called PCP 6/18 and was prescribed Zofran but was not able to pick up due to severity of symptoms; therefore, went to ED. She has not had much to eat or drink in past 2 days due to not being able to keep anything down. She was also just recently started on Lisinopril-hydrochlorothiazide on  10/27/21 after being off BP meds since Oct 2023 (Lotensin).  In ED, she was found to have Na of 107. She was started on NS and repeat 1 hour later was 111. Besides nausea/vomiting, dizziness, she had not had any additional symptoms. Denies any confusion, weakness, AMS, syncope, seizures.  Due to severity of her hyponatremia, PCCM called for admission to ICU for closer monitoring.  Pertinent  Medical History:  has Diabetes mellitus without complication (HCC); Hyperlipidemia; CARPAL TUNNEL SYNDROME; Essential hypertension; GERD; Osteoarthritis; Anxiety disorder; Glaucoma; Costochondritis; and Hyponatremia on their problem list.  Significant Hospital Events: Including procedures, antibiotic start and stop dates in addition to other pertinent events   6/19 admit; initially corrected sodium rapidly after saline administration; started D5w infusion  Interim History / Subjective:  Today she is feeling well overall. She had a headache overnight. No new complaints today.   Objective:  Blood pressure (!) 137/99, pulse 72,  temperature 98.1 F (36.7 C), temperature source Oral, resp. rate 14, height 5' 7.72" (1.72 m), weight 63.9 kg, SpO2 100 %.        Intake/Output Summary (Last 24 hours) at 11/10/2022 1024 Last data filed at 11/10/2022 0800 Gross per 24 hour  Intake 1485.02 ml  Output 550 ml  Net 935.02 ml    Filed Weights   11/09/22 0500 11/10/22 0500  Weight: 62 kg 63.9 kg    Examination : General: middle aged woman lying in bed in NAD Neuro: arouses to verbal stimulation, normal speech, answering questions appropriately. Moving all extremities.  HEENT: [place]/AT, eyes anicteric Cardiovascular: S1S2, RRR Lungs: breathing comfortably on RA, CTAB Abdomen: soft, NT, ND Musculoskeletal: no cyanosis or clubbing Skin: warm, dry, no diffuse rashes  Na+ 107>  111> 111>  118> 123> 124> 124> 124> 123  BUN 8 Cr 1.0 WBC 3.9 H/H 11.8/31.9   Assessment & Plan:   Hyponatremia, hypotonic & euvolemic -  2/2 nausea/vomiting and poor PO intake in setting COVID and possibly exacerbated after Molnupiravir which she was prescribed 1 week ago. Of note, she was also recently started on Lisinopril-hydrochlorothiazide daily (10/28/22). With rapid correction this is most likely due to hydrochlorothiazide -Con't D52, later this afternoon can stop D5w and monitor how she rises with just eating.  -serial Na+  q4h -would recommend against using hydrochlorothiazide in the future; ACE is likely fine -since she has been >120 for 24 hrs, she is stable to transfer out of ICU  Nausea and vomiting - possibly reaction to Molnupiravir vs covid-- improved -zofran PRN -PO intake as tolerated  Hx HTN, HLD - PRN hydralazine -hold  PTA hydrochlorothiazide & lisinopril -con't zetia  Hypomagnesemia -  resolved Hypophosphatemia-resolved -monitor  Hx DM; A1c 5.0 -con't holding PTA metformin -SSI PRN -goal BG 140-180  Hx COVID (diagnosed 11/02/22) and s/p course Molnupiravir -con't isolation through 6/21 for 10 day  total  Anemia -transfuse for Hb <7 or hemodynamically significant bleeding  Leukopenia, likely 2/2 acute illness with covid monitor   Stable to transfer out of ICU today; TRH to assume care tomorrow.   Best practice (evaluated daily):  Diet/type: Regular consistency (see orders) DVT prophylaxis: prophylactic heparin  GI prophylaxis: N/A Lines: N/A Foley:  N/A Code Status:  full code Last date of multidisciplinary goals of care discussion:    Labs   CBC: Recent Labs  Lab 11/08/22 2211 11/09/22 0257  WBC 3.8* 3.9*  HGB 12.1 11.8*  HCT 32.6* 31.9*  MCV 80.5 80.4  PLT 225 213     Basic Metabolic Panel: Recent Labs  Lab 11/08/22 2211 11/08/22 2307 11/09/22 0257 11/09/22 0642 11/09/22 1040 11/09/22 1406 11/09/22 1857 11/09/22 2247 11/10/22 0642  NA 107* 111* 111*   < > 123* 124* 124* 124* 123*  K 4.1 3.5 3.6  --   --   --   --   --  3.9  CL 75* 80* 79*  --   --   --   --   --  93*  CO2 19* 21* 21*  --   --   --   --   --  22  GLUCOSE 163* 136* 121*  --   --   --   --   --  114*  BUN 9 9 7   --   --   --   --   --  8  CREATININE 0.87 0.79 0.86  --   --   --   --   --  1.00  CALCIUM 9.1 8.4* 8.6*  --   --   --   --   --  9.0  MG 1.4*  --  2.1  --   --   --   --   --  1.9  PHOS  --   --  1.7*  --   --   --   --   --  3.6   < > = values in this interval not displayed.    GFR: Estimated Creatinine Clearance: 61.2 mL/min (by C-G formula based on SCr of 1 mg/dL). Recent Labs  Lab 11/08/22 2211 11/09/22 0257  WBC 3.8* 3.9*     Liver Function Tests: Recent Labs  Lab 11/08/22 2211 11/10/22 0642  AST 33 24  ALT 20 23  ALKPHOS 47 49  BILITOT 0.6 0.5  PROT 7.5 6.8  ALBUMIN 4.7 3.9    Recent Labs  Lab 11/08/22 2211  LIPASE 141*      Critical care time:       Steffanie Dunn, DO 11/10/22 11:18 AM [facility]  For contact information, see Amion. If no response to pager, please call PCCM consult pager. After hours, 7PM- 7AM,  please call Elink.

## 2022-11-10 NOTE — Progress Notes (Signed)
Notified by elink RN 0200 sodium labs were canceled. New orders placed by elink MD. Phlebotomist called.

## 2022-11-10 NOTE — Progress Notes (Signed)
Had been prescribed augmentin for otitis. Re-ordered for 7 days.  Steffanie Dunn, DO 11/10/22 5:02 PM [facility]  For contact information, see Amion. If no response to pager, please call PCCM consult pager. After hours, 7PM- 7AM, please call Elink.

## 2022-11-11 DIAGNOSIS — E871 Hypo-osmolality and hyponatremia: Secondary | ICD-10-CM | POA: Diagnosis not present

## 2022-11-11 LAB — BASIC METABOLIC PANEL
Anion gap: 10 (ref 5–15)
Anion gap: 15 (ref 5–15)
BUN: 11 mg/dL (ref 6–20)
BUN: 15 mg/dL (ref 6–20)
CO2: 23 mmol/L (ref 22–32)
CO2: 21 mmol/L — ABNORMAL LOW (ref 22–32)
Calcium: 9.5 mg/dL (ref 8.9–10.3)
Calcium: 10 mg/dL (ref 8.9–10.3)
Chloride: 91 mmol/L — ABNORMAL LOW (ref 98–111)
Chloride: 95 mmol/L — ABNORMAL LOW (ref 98–111)
Creatinine, Ser: 1.05 mg/dL — ABNORMAL HIGH (ref 0.44–1.00)
Creatinine, Ser: 1.17 mg/dL — ABNORMAL HIGH (ref 0.44–1.00)
GFR, Estimated: >60 mL/min (ref >60)
GFR, Estimated: 54 mL/min — ABNORMAL LOW (ref >60)
Glucose, Bld: 110 mg/dL — ABNORMAL HIGH (ref 70–99)
Glucose, Bld: 102 mg/dL — ABNORMAL HIGH (ref 70–99)
Potassium: 4.4 mmol/L (ref 3.5–5.1)
Potassium: 5.1 mmol/L (ref 3.5–5.1)
Sodium: 124 mmol/L — ABNORMAL LOW (ref 135–145)
Sodium: 131 mmol/L — ABNORMAL LOW (ref 135–145)

## 2022-11-11 LAB — URINALYSIS, ROUTINE W REFLEX MICROSCOPIC
Bilirubin Urine: NEGATIVE
Glucose, UA: NEGATIVE mg/dL
Hgb urine dipstick: NEGATIVE
Ketones, ur: NEGATIVE mg/dL
Leukocytes,Ua: NEGATIVE
Nitrite: NEGATIVE
Protein, ur: NEGATIVE mg/dL
Specific Gravity, Urine: 1.003 — ABNORMAL LOW (ref 1.005–1.030)
pH: 6 (ref 5.0–8.0)

## 2022-11-11 LAB — GLUCOSE, CAPILLARY
Glucose-Capillary: 103 mg/dL — ABNORMAL HIGH (ref 70–99)
Glucose-Capillary: 105 mg/dL — ABNORMAL HIGH (ref 70–99)
Glucose-Capillary: 130 mg/dL — ABNORMAL HIGH (ref 70–99)
Glucose-Capillary: 83 mg/dL (ref 70–99)
Glucose-Capillary: 90 mg/dL (ref 70–99)
Glucose-Capillary: 98 mg/dL (ref 70–99)

## 2022-11-11 LAB — SODIUM, URINE, RANDOM: Sodium, Ur: 12 mmol/L

## 2022-11-11 LAB — OSMOLALITY: Osmolality: 265 mOsm/kg — ABNORMAL LOW (ref 275–295)

## 2022-11-11 LAB — URIC ACID: Uric Acid, Serum: 4 mg/dL (ref 2.5–7.1)

## 2022-11-11 LAB — BRAIN NATRIURETIC PEPTIDE: B Natriuretic Peptide: 18.8 pg/mL (ref 0.0–100.0)

## 2022-11-11 LAB — MAGNESIUM: Magnesium: 1.9 mg/dL (ref 1.7–2.4)

## 2022-11-11 LAB — CREATININE, URINE, RANDOM: Creatinine, Urine: 23 mg/dL

## 2022-11-11 LAB — SODIUM
Sodium: 119 mmol/L — CL (ref 135–145)
Sodium: 129 mmol/L — ABNORMAL LOW (ref 135–145)

## 2022-11-11 LAB — TSH: TSH: 2.651 u[IU]/mL (ref 0.350–4.500)

## 2022-11-11 LAB — OSMOLALITY, URINE: Osmolality, Ur: 108 mOsm/kg — ABNORMAL LOW (ref 300–900)

## 2022-11-11 MED ORDER — PANTOPRAZOLE SODIUM 40 MG PO TBEC
40.0000 mg | DELAYED_RELEASE_TABLET | Freq: Every day | ORAL | Status: DC
  Administered 2022-11-11 – 2022-11-12 (×2): 40 mg via ORAL
  Filled 2022-11-11 (×2): qty 1

## 2022-11-11 MED ORDER — CARVEDILOL 3.125 MG PO TABS
3.1250 mg | ORAL_TABLET | Freq: Two times a day (BID) | ORAL | Status: DC
  Filled 2022-11-11 (×2): qty 1

## 2022-11-11 MED ORDER — DEXTROSE 5 % IV SOLN: INTRAVENOUS | Status: DC

## 2022-11-11 MED ORDER — LATANOPROST 0.005 % OP SOLN
1.0000 [drp] | Freq: Every day | OPHTHALMIC | Status: DC
  Administered 2022-11-11: 1 [drp] via OPHTHALMIC
  Filled 2022-11-11: qty 2.5

## 2022-11-11 MED ORDER — SODIUM CHLORIDE 0.9 % IV SOLN: INTRAVENOUS | Status: DC

## 2022-11-11 NOTE — TOC CM/SW Note (Signed)
Transition of Care [facility]) - Inpatient Brief Assessment   Patient Details  Name: Aaleyah Witherow MRN: [id number] Date of Birth: 1965-08-01  [facility]) CM/SW Contact:    Gordy Clement, RN Phone Number: 11/11/2022, 11:10 AM   Clinical Narrative:  Patient is from home with Husband.  Independent at baseline.  Patient has established PCP and insurance.   PT and OT order for eval is in.  TOC will continue to follow patient for any additional discharge needs       Transition of Care Asessment: Insurance and Status: Insurance coverage has been reviewed Patient has primary care physician: Yes Clent Ridges, Tera Mater, MD) Home environment has been reviewed: Home with Spouse Prior level of function:: Independent Prior/Current Home Services: No current home services Social Determinants of Health Reivew: SDOH reviewed no interventions necessary Readmission risk has been reviewed: Yes (11%) Transition of care needs: no transition of care needs at this time

## 2022-11-11 NOTE — Consult Note (Addendum)
Reason for Consult: Hyponatremia Referring Physician: Susa Raring MD  Assessment/Plan:  #Hyponatremia Dilute urine-Uosm 108, Una 12, spec gra 1.003. TSH appropriate. Likely in the setting of recent thiazide re-initation and GI losses from COVID. Na [id number] today. Increasing too quickly after D5 was stopped earlier today. -reinitiate D5 @ 30  -Na q4h -Avoid thiazide use  #Recent Covid Asymptomatic, completed molnupiravir on Monday per pt -off isolation 11/11/22  #AoCD -stable  #HTN  On Coreg here. -monitor -avoid HCTZ  HPI: Brittany [name] is an 57 y.o. female   Hospital course: Admitted to ICU on 6/19 for symptomatic severe hyponatremia (Na 107) secondary to recent thiazide reinitiation + unable to hold anything down for 2 days prior to admission/GI losses with recent COVID. NS Bolus x 1 in ED on 6/18. Had some mild improvement with normal saline administration (111) and was given three 3% hypertonic boluses in ICU on 6/19. 6/19 rapid correction (124) > saline stopped and D5W started 6/19 through 6/21. Transferred out to floor on 6/21.  Today she states her appetite is back and has been eating, no GI losses currently. States that she was trying to stay hydrated prior to admission while not eating as well for around 2 weeks. Started vomiting with her COVID. This in addition to hydrochlorothiazide addition last week likely caused her hyponatremia.  Chemistry and CBC: Creatinine, Ser  Date/Time Value Ref Range Status  11/11/2022 07:49 AM 1.05 (H) 0.44 - 1.00 mg/dL Final  16/02/9603 54:09 AM 1.00 0.44 - 1.00 mg/dL Final  81/19/1478 29:56 AM 0.86 0.44 - 1.00 mg/dL Final  21/30/8657 84:69 PM 0.79 0.44 - 1.00 mg/dL Final  62/95/2841 32:44 PM 0.87 0.44 - 1.00 mg/dL Final  05/25/7251 66:44 AM 1.10 0.40 - 1.20 mg/dL Final  03/47/4259 56:38 AM 1.04 0.40 - 1.20 mg/dL Final  75/64/3329 51:88 AM 0.88 0.40 - 1.20 mg/dL Final  41/66/0630 16:01 AM 1.03 0.40 - 1.20 mg/dL Final   09/32/3557 32:20 AM 0.93 0.40 - 1.20 mg/dL Final  25/42/7062 37:62 AM 0.92 0.40 - 1.20 mg/dL Final  83/15/1761 60:73 AM 0.83 0.40 - 1.20 mg/dL Final  71/10/2692 85:46 PM 0.76 0.50 - 1.10 mg/dL Final  27/07/5007 38:18 AM 0.8 0.4 - 1.2 mg/dL Final  29/93/7169 67:89 AM 0.8 0.4 - 1.2 mg/dL Final  38/02/1750 02:58 AM 1.0 0.4 - 1.2 mg/dL Final  52/77/8242 35:36 AM 0.8 0.4 - 1.2 mg/dL Final  14/43/1540 08:67 AM 0.7 0.4 - 1.2 mg/dL Final  61/95/0932 67:12 AM 0.7 0.4 - 1.2 mg/dL Final   Recent Labs  Lab 11/08/22 2211 11/08/22 2307 11/09/22 0257 11/09/22 0642 11/10/22 0642 11/10/22 1051 11/10/22 1342 11/10/22 1736 11/10/22 2234 11/11/22 0201 11/11/22 0749 11/11/22 1007  NA 107* 111* 111*   < > 123* 125* 125* 123* 122* 119* 124* 129*  K 4.1 3.5 3.6  --  3.9  --   --   --   --   --  4.4  --   CL 75* 80* 79*  --  93*  --   --   --   --   --  91*  --   CO2 19* 21* 21*  --  22  --   --   --   --   --  23  --   GLUCOSE 163* 136* 121*  --  114*  --   --   --   --   --  110*  --   BUN 9 9 7   --  8  --   --   --   --   --  11  --   CREATININE 0.87 0.79 0.86  --  1.00  --   --   --   --   --  1.05*  --   CALCIUM 9.1 8.4* 8.6*  --  9.0  --   --   --   --   --  9.5  --   PHOS  --   --  1.7*  --  3.6  --   --   --   --   --   --   --    < > = values in this interval not displayed.   Recent Labs  Lab 11/08/22 2211 11/09/22 0257  WBC 3.8* 3.9*  HGB 12.1 11.8*  HCT 32.6* 31.9*  MCV 80.5 80.4  PLT 225 213   Liver Function Tests: Recent Labs  Lab 11/08/22 2211 11/10/22 0642  AST 33 24  ALT 20 23  ALKPHOS 47 49  BILITOT 0.6 0.5  PROT 7.5 6.8  ALBUMIN 4.7 3.9   Recent Labs  Lab 11/08/22 2211  LIPASE 141*   No results for input(s): "AMMONIA" in the last 168 hours. Cardiac Enzymes: No results for input(s): "CKTOTAL", "CKMB", "CKMBINDEX", "TROPONINI" in the last 168 hours. Iron Studies: No results for input(s): "IRON", "TIBC", "TRANSFERRIN", "FERRITIN" in the last 72  hours. PT/INR: @LABRCNTIP (inr:5)  Xrays/Other Studies: ) Results for orders placed or performed during the hospital encounter of 11/08/22 (from the past 48 hour(s))  Sodium     Status: Abnormal   Collection Time: 11/09/22  2:06 PM  Result Value Ref Range   Sodium 124 (L) 135 - 145 mmol/L    Comment: Performed at [facility], [address], [place], [place] [address]  Glucose, capillary     Status: Abnormal   Collection Time: 11/09/22  3:56 PM  Result Value Ref Range   Glucose-Capillary 124 (H) 70 - 99 mg/dL    Comment: Glucose reference range applies only to samples taken after fasting for at least 8 hours.  Sodium     Status: Abnormal   Collection Time: 11/09/22  6:57 PM  Result Value Ref Range   Sodium 124 (L) 135 - 145 mmol/L    Comment: Performed at [facility], [address], [place], [place] [address]  Glucose, capillary     Status: Abnormal   Collection Time: 11/09/22  7:56 PM  Result Value Ref Range   Glucose-Capillary 122 (H) 70 - 99 mg/dL    Comment: Glucose reference range applies only to samples taken after fasting for at least 8 hours.  Sodium     Status: Abnormal   Collection Time: 11/09/22 10:47 PM  Result Value Ref Range   Sodium 124 (L) 135 - 145 mmol/L    Comment: Performed at [facility], [address], [place], [place] [address]  Glucose, capillary     Status: Abnormal   Collection Time: 11/09/22 11:41 PM  Result Value Ref Range   Glucose-Capillary 106 (H) 70 - 99 mg/dL    Comment: Glucose reference range applies only to samples taken after fasting for at least 8 hours.  Glucose, capillary     Status: Abnormal   Collection Time: 11/10/22  3:33 AM  Result Value Ref Range   Glucose-Capillary 106 (H) 70 - 99 mg/dL    Comment: Glucose reference range applies only to samples taken after fasting for at least 8 hours.  Phosphorus     Status: None   Collection Time:  11/10/22  6:42 AM  Result Value Ref Range   Phosphorus 3.6 2.5 - 4.6  mg/dL    Comment: Performed at [facility], [address] [place], [place] [address]  Magnesium     Status: None   Collection Time: 11/10/22  6:42 AM  Result Value Ref Range   Magnesium 1.9 1.7 - 2.4 mg/dL    Comment: Performed at [facility], [address], [place], [place] [address]  Comprehensive metabolic panel     Status: Abnormal   Collection Time: 11/10/22  6:42 AM  Result Value Ref Range   Sodium 123 (L) 135 - 145 mmol/L   Potassium 3.9 3.5 - 5.1 mmol/L   Chloride 93 (L) 98 - 111 mmol/L   CO2 22 22 - 32 mmol/L   Glucose, Bld 114 (H) 70 - 99 mg/dL    Comment: Glucose reference range applies only to samples taken after fasting for at least 8 hours.   BUN 8 6 - 20 mg/dL   Creatinine, Ser 9.62 0.44 - 1.00 mg/dL   Calcium 9.0 8.9 - 95.2 mg/dL   Total Protein 6.8 6.5 - 8.1 g/dL   Albumin 3.9 3.5 - 5.0 g/dL   AST 24 15 - 41 U/L   ALT 23 0 - 44 U/L   Alkaline Phosphatase 49 38 - 126 U/L   Total Bilirubin 0.5 0.3 - 1.2 mg/dL   GFR, Estimated >84 >13 mL/min    Comment: (NOTE) Calculated using the CKD-EPI Creatinine Equation (2021)    Anion gap 8 5 - 15    Comment: Performed at [facility], [address] [place], [place] [address]  Glucose, capillary     Status: None   Collection Time: 11/10/22  7:57 AM  Result Value Ref Range   Glucose-Capillary 96 70 - 99 mg/dL    Comment: Glucose reference range applies only to samples taken after fasting for at least 8 hours.  Sodium     Status: Abnormal   Collection Time: 11/10/22 10:51 AM  Result Value Ref Range   Sodium 125 (L) 135 - 145 mmol/L    Comment: Performed at [facility], [address] [place], [place] [address]  Glucose, capillary     Status: Abnormal   Collection Time: 11/10/22 11:00 AM  Result Value Ref Range   Glucose-Capillary 112 (H) 70 - 99 mg/dL    Comment: Glucose reference range applies only to samples taken after fasting for at least 8 hours.  Sodium     Status: Abnormal    Collection Time: 11/10/22  1:42 PM  Result Value Ref Range   Sodium 125 (L) 135 - 145 mmol/L    Comment: Performed at [facility], [address] [place], [place] [address]  Glucose, capillary     Status: Abnormal   Collection Time: 11/10/22  4:22 PM  Result Value Ref Range   Glucose-Capillary 103 (H) 70 - 99 mg/dL    Comment: Glucose reference range applies only to samples taken after fasting for at least 8 hours.  Sodium     Status: Abnormal   Collection Time: 11/10/22  5:36 PM  Result Value Ref Range   Sodium 123 (L) 135 - 145 mmol/L    Comment: Performed at [facility], [address] [place], [place] [address]  Glucose, capillary     Status: Abnormal   Collection Time: 11/10/22  8:04 PM  Result Value Ref Range  Glucose-Capillary 126 (H) 70 - 99 mg/dL    Comment: Glucose reference range applies only to samples taken after fasting for at least 8 hours.   Comment 1 Notify RN    Comment 2 Document in Chart   Sodium     Status: Abnormal   Collection Time: 11/10/22 10:34 PM  Result Value Ref Range   Sodium 122 (L) 135 - 145 mmol/L    Comment: Performed at [facility], [address], [place], [place] [address]  Glucose, capillary     Status: None   Collection Time: 11/11/22 12:11 AM  Result Value Ref Range   Glucose-Capillary 90 70 - 99 mg/dL    Comment: Glucose reference range applies only to samples taken after fasting for at least 8 hours.   Comment 1 Notify RN    Comment 2 Document in Chart   Sodium     Status: Abnormal   Collection Time: 11/11/22  2:01 AM  Result Value Ref Range   Sodium 119 (LL) 135 - 145 mmol/L    Comment: CRITICAL RESULT CALLED TO, READ BACK BY AND VERIFIED WITH BROWN,S RN @0310  11/11/22 SATRAINR Performed at [facility], [address] [place], [place] [address]   Glucose, capillary     Status: Abnormal   Collection Time: 11/11/22  3:23 AM  Result Value Ref Range   Glucose-Capillary 105 (H) 70 - 99 mg/dL    Comment:  Glucose reference range applies only to samples taken after fasting for at least 8 hours.   Comment 1 Notify RN    Comment 2 Document in Chart   Magnesium     Status: None   Collection Time: 11/11/22  7:49 AM  Result Value Ref Range   Magnesium 1.9 1.7 - 2.4 mg/dL    Comment: Performed at [facility], [address], [place], [place] [address]  Brain natriuretic peptide     Status: None   Collection Time: 11/11/22  7:49 AM  Result Value Ref Range   B Natriuretic Peptide 18.8 0.0 - 100.0 pg/mL    Comment: Performed at [facility], [address], [place], [place] [address]  Osmolality     Status: Abnormal   Collection Time: 11/11/22  7:49 AM  Result Value Ref Range   Osmolality 265 (L) 275 - 295 mOsm/kg    Comment: Performed at [facility], [address], [place], [place] [address]  Uric acid     Status: None   Collection Time: 11/11/22  7:49 AM  Result Value Ref Range   Uric Acid, Serum 4.0 2.5 - 7.1 mg/dL    Comment: Performed at [facility], [address] [place], [place] [address]  Basic metabolic panel     Status: Abnormal   Collection Time: 11/11/22  7:49 AM  Result Value Ref Range   Sodium 124 (L) 135 - 145 mmol/L   Potassium 4.4 3.5 - 5.1 mmol/L   Chloride 91 (L) 98 - 111 mmol/L   CO2 23 22 - 32 mmol/L   Glucose, Bld 110 (H) 70 - 99 mg/dL    Comment: Glucose reference range applies only to samples taken after fasting for at least 8 hours.   BUN 11 6 - 20 mg/dL   Creatinine, Ser 5.78 (H) 0.44 - 1.00 mg/dL   Calcium 9.5 8.9 - 46.9 mg/dL   GFR, Estimated >62 >95 mL/min    Comment: (NOTE) Calculated using the CKD-EPI Creatinine  Equation (2021)    Anion gap 10 5 - 15    Comment: Performed at [facility], [address] [place], [place] [address]  Sodium, urine, random     Status: None   Collection Time: 11/11/22  7:58 AM  Result Value Ref Range   Sodium, Ur 12 mmol/L    Comment: Performed at [facility], [address] [place], [place] [address]  Osmolality, urine     Status: Abnormal   Collection Time: 11/11/22  7:58 AM  Result Value Ref Range   Osmolality, Ur 108 (L) 300 - 900 mOsm/kg    Comment: Performed at [facility], [address], [place], [place] [address]  Creatinine, urine, random     Status: None   Collection Time: 11/11/22  7:58 AM  Result Value Ref Range   Creatinine, Urine 23 mg/dL    Comment: Performed at [facility], [address] [place], [place] [address]  Urinalysis, Routine w reflex microscopic -Urine, Clean Catch     Status: Abnormal   Collection Time: 11/11/22  7:58 AM  Result Value Ref Range   Color, Urine COLORLESS (A) YELLOW   APPearance CLEAR CLEAR   Specific Gravity, Urine 1.003 (L) 1.005 - 1.030   pH 6.0 5.0 - 8.0   Glucose, UA NEGATIVE NEGATIVE mg/dL   Hgb urine dipstick NEGATIVE NEGATIVE   Bilirubin Urine NEGATIVE NEGATIVE   Ketones, ur NEGATIVE NEGATIVE mg/dL   Protein, ur NEGATIVE NEGATIVE mg/dL   Nitrite NEGATIVE NEGATIVE   Leukocytes,Ua NEGATIVE NEGATIVE    Comment: Performed at [facility], [address] [place], [place] [address]  Glucose, capillary     Status: None   Collection Time: 11/11/22  8:30 AM  Result Value Ref Range   Glucose-Capillary 98 70 - 99 mg/dL    Comment: Glucose reference range applies only to samples taken after fasting for at least 8 hours.  Sodium     Status: Abnormal   Collection Time: 11/11/22 10:07 AM  Result Value Ref Range   Sodium 129 (L) 135 - 145 mmol/L    Comment: DELTA CHECK NOTED Performed at [facility], [address] [place], [place] [address]    No results found.  PMH:   Past Medical History:  Diagnosis Date   Diabetes mellitus    type II   GERD (gastroesophageal reflux disease)    Glaucoma    sees Dr. Arvil Chaco   Hyperlipidemia    Hypertension     PSH:   Past Surgical History:  Procedure Laterality Date   COLONOSCOPY  04/22/2016   per Dr. Loreta Ave, adenomatous polyps,  repeat in 5 yrs    ESOPHAGOGASTRODUODENOSCOPY  04/22/2016   per Dr. Loreta Ave, normal except small hiatal hernia    VAGINAL HYSTERECTOMY     ovaries intact    Allergies: No Known Allergies  Medications:   Prior to Admission medications   Medication Sig Start Date End Date Taking? Authorizing Provider  acetaminophen (TYLENOL 8 HOUR ARTHRITIS PAIN) 650 MG CR tablet Take 1,300 mg by mouth as needed for pain. Patient used once to twice daily depending on pain.   Yes [provider]  aspirin 81 MG chewable tablet Chew 1 tablet (81 mg total) by mouth daily. 02/03/14  Yes Pfeiffer, Lebron Conners, MD  bimatoprost (LUMIGAN) 0.01 % SOLN Place 1 drop into both eyes at bedtime.   Yes [provider]  bismuth subsalicylate (PEPTO BISMOL)  262 MG/15ML suspension Take 30 mLs by mouth every 6 (six) hours as needed for indigestion. Patient was using up to 3-4 times daily depending on upset stomach. Patient used a bottle and a half in 2 days last week.   Yes [provider]  Cholecalciferol (VITAMIN D-3 PO) Take 1 capsule by mouth daily.   Yes [provider]  ezetimibe (ZETIA) 10 MG tablet Take 1 tablet (10 mg total) by mouth daily. 03/23/22  Yes Nelwyn Salisbury, MD  lisinopril-hydrochlorothiazide (ZESTORETIC) 20-25 MG tablet Take 1 tablet by mouth daily. 10/28/22  Yes Nelwyn Salisbury, MD  LORazepam (ATIVAN) 2 MG tablet TAKE 1 TABLET BY MOUTH EVERY 8  HOURS AS NEEDED FOR ANXIETY Patient taking differently: Take 2 mg by mouth every 8 (eight) hours as needed for anxiety. for anxiety 09/21/22  Yes Nelwyn Salisbury, MD  metFORMIN (GLUCOPHAGE) 1000 MG tablet TAKE ONE-HALF TABLET BY MOUTH  TWICE DAILY WITH MEALS Patient taking differently: Take 500 mg by mouth 2 (two) times daily with a meal. 09/05/22  Yes Nelwyn Salisbury, MD  omeprazole (PRILOSEC) 40 MG capsule Take 1 capsule (40 mg total) by mouth daily. 06/28/22  Yes Nelwyn Salisbury, MD  blood glucose meter kit and supplies KIT Dispense based on patient  and insurance preference. Use up to four times daily as directed. (FOR ICD-9 250.00, 250.01). 11/28/19   Nelwyn Salisbury, MD  glucose blood [facility] VERIO) test strip TEST ONCE DAILY 10/18/22   Nelwyn Salisbury, MD  ondansetron (ZOFRAN) 8 MG tablet Take 1 tablet (8 mg total) by mouth every 6 (six) hours as needed for nausea or vomiting. 11/08/22   Nelwyn Salisbury, MD  [facility] LANCETS FINE MISC TEST ONCE A DAY 06/26/17   Nelwyn Salisbury, MD    Discontinued Meds:   Medications Discontinued During This Encounter  Medication Reason   hydrALAZINE (APRESOLINE) injection 10-40 mg    0.9 %  sodium chloride infusion    diclofenac (VOLTAREN) 75 MG EC tablet Patient Preference   insulin aspart (novoLOG) injection 0-15 Units    heparin injection 5,000 Units    dextrose 5 % solution    0.9 %  sodium chloride infusion     Social History:  reports that she has never smoked. She has never used smokeless tobacco. She reports current alcohol use. She reports that she does not use drugs.  Family History:   Family History  Problem Relation Age of Onset   Arthritis Other    Heart disease Other    Diabetes Other    Hyperlipidemia Other    Hypertension Other    Stroke Other     Blood pressure 125/89, pulse 82, temperature 97.6 F (36.4 C), temperature source Oral, resp. rate 19, height 5' 7.72" (1.72 m), weight 62.4 kg, SpO2 100 %.  General: Well appearing, NAD, awake, alert, responsive to questions Head: Normocephalic atraumatic CV: Regular rate and rhythm no murmurs rubs or gallops Respiratory: Clear to ausculation bilaterally, no wheezes rales or crackles, chest rises symmetrically,  no increased work of breathing on RA Extremities: Moves upper and lower extremities freely, no edema in LE   Levin Erp, MD 11/11/2022, 11:57 AM

## 2022-11-11 NOTE — Progress Notes (Addendum)
PROGRESS NOTE                                                                                                                                                                                                             Patient Demographics:    Brittany [name], is a 57 y.o. female, DOB - [birth year], QIH:[id number]  Outpatient Primary MD for the patient is Nelwyn Salisbury, MD    LOS - 2  Admit date - 11/08/2022    Chief Complaint  Patient presents with   Emesis   Chest Pain       Brief Narrative (HPI from H&P)    57 y.o. female who has a PMH as below. She presented to [facility] 6/18 with nausea, vomiting, dizziness, chest pressure vs indigestion x 2 - 3 days. She was diagnosed with COVID 6/12 and started on Molnupiravir. States she started taking this and experienced worsening of nausea and vomiting which was only intermittent/mild prior to starting therapy. She called PCP 6/18 and was prescribed Zofran but was not able to pick up due to severity of symptoms; therefore, went to ED. She has not had much to eat or drink in past 2 days due to not being able to keep anything down. She was also just recently started on Lisinopril-hydrochlorothiazide on  10/27/21 after being off BP meds since Oct 2023 (Lotensin).   In ED, she was found to have Na of 107. She was started on NS and repeat 1 hour later was 111. Besides nausea/vomiting, dizziness, she had not had any additional symptoms. Denies any confusion, weakness, AMS, syncope, seizures.  Due to severity of her hyponatremia, PCCM called for admission to ICU for closer monitoring, she was kept in the ICU transferred to the hospitalist service on 11/08/2022.  In the ICU there was a brief episode of rapid correction which was corrected by D5W.   Subjective:    Ernesteen Mihalic today has, No headache, No chest pain, No abdominal pain - No Nausea, No new weakness tingling or numbness, no  SOB.   Assessment  & Plan :    Severe symptomatic hyponatremia.  Appears to be consistent with hypovolemia caused by HCTZ use with some element of excessive free water intake.  Offending medications were stopped, she was corrected in the ICU, sodium is gradually coming up, placed on  PO free water restriction, monitor BMP closely.  Nephrology will also evaluate.  Urine electrolytes noted with urine sodium of around 12, urine osmolality much less than serum.  Recent episode of nausea vomiting.  Side effect of her antiviral medication she was given, resolved.  Recent diagnosis of COVID-19.  Asymptomatic.  Finishing isolation on 11/11/2022.  Anemia of chronic disease.  Stable.  Hypertension.  Placed on Coreg.       Condition - Fair  Family Communication  :  None  Code Status :  Full  Consults  :  Renal, PCCM  PUD Prophylaxis : PPI   Procedures  :            Disposition Plan  :    Status is: Inpatient  DVT Prophylaxis  :    enoxaparin (LOVENOX) injection 40 mg Start: 11/10/22 1400 SCDs Start: 11/09/22 0154    Lab Results  Component Value Date   PLT 213 11/09/2022    Diet :  Diet Order             Diet regular Room service appropriate? Yes; Fluid consistency: Thin  Diet effective now                    Inpatient Medications  Scheduled Meds:  amoxicillin-clavulanate  1 tablet Oral Q12H   aspirin  81 mg Oral Daily   carvedilol  3.125 mg Oral BID WC   Chlorhexidine Gluconate Cloth  6 each Topical Q0600   enoxaparin (LOVENOX) injection  40 mg Subcutaneous Daily   fluticasone  1 spray Each Nare Daily   insulin aspart  0-15 Units Subcutaneous TID WC   latanoprost  1 drop Both Eyes QHS   pantoprazole  40 mg Oral Daily   Continuous Infusions:   PRN Meds:.acetaminophen, butalbital-acetaminophen-caffeine, docusate sodium, hydrALAZINE, LORazepam, ondansetron (ZOFRAN) IV, mouth rinse, polyethylene glycol     Objective:   Vitals:   11/11/22 0013  11/11/22 0324 11/11/22 0710 11/11/22 0833  BP: (!) 150/98 118/89  125/89  Pulse:    82  Resp: 16 16  19   Temp: (!) 97.5 F (36.4 C) (!) 97.5 F (36.4 C)  97.6 F (36.4 C)  TempSrc: Oral Oral  Oral  SpO2:      Weight:   62.4 kg   Height:        Wt Readings from Last 3 Encounters:  11/11/22 62.4 kg  11/02/22 65.7 kg  03/18/22 63 kg     Intake/Output Summary (Last 24 hours) at 11/11/2022 1141 Last data filed at 11/11/2022 0745 Gross per 24 hour  Intake 708.39 ml  Output 600 ml  Net 108.39 ml     Physical Exam  Awake Alert, No new F.N deficits, Normal affect [place].AT,PERRAL Supple Neck, No JVD,   Symmetrical Chest wall movement, Good air movement bilaterally, CTAB RRR,No Gallops,Rubs or new Murmurs,  +ve B.Sounds, Abd Soft, No tenderness,   No Cyanosis, Clubbing or edema       Data Review:    Recent Labs  Lab 11/08/22 2211 11/09/22 0257  WBC 3.8* 3.9*  HGB 12.1 11.8*  HCT 32.6* 31.9*  PLT 225 213  MCV 80.5 80.4  MCH 29.9 29.7  MCHC 37.1* 37.0*  RDW 12.1 11.9    Recent Labs  Lab 11/08/22 2211 11/08/22 2307 11/09/22 0257 11/09/22 0642 11/10/22 0642 11/10/22 1051 11/10/22 1736 11/10/22 2234 11/11/22 0201 11/11/22 0749 11/11/22 1007  NA 107* 111* 111*   < > 123*   < >  123* 122* 119* 124* 129*  K 4.1 3.5 3.6  --  3.9  --   --   --   --  4.4  --   CL 75* 80* 79*  --  93*  --   --   --   --  91*  --   CO2 19* 21* 21*  --  22  --   --   --   --  23  --   ANIONGAP 13 10 11   --  8  --   --   --   --  10  --   GLUCOSE 163* 136* 121*  --  114*  --   --   --   --  110*  --   BUN 9 9 7   --  8  --   --   --   --  11  --   CREATININE 0.87 0.79 0.86  --  1.00  --   --   --   --  1.05*  --   AST 33  --   --   --  24  --   --   --   --   --   --   ALT 20  --   --   --  23  --   --   --   --   --   --   ALKPHOS 47  --   --   --  49  --   --   --   --   --   --   BILITOT 0.6  --   --   --  0.5  --   --   --   --   --   --   ALBUMIN 4.7  --   --   --  3.9  --   --    --   --   --   --   HGBA1C  --   --  5.0  --   --   --   --   --   --   --   --   BNP  --   --   --   --   --   --   --   --   --  18.8  --   MG 1.4*  --  2.1  --  1.9  --   --   --   --  1.9  --   CALCIUM 9.1 8.4* 8.6*  --  9.0  --   --   --   --  9.5  --    < > = values in this interval not displayed.   Lab Results  Component Value Date   CHOL 280 (H) 03/18/2022   HDL 60.80 03/18/2022   LDLCALC 202 (H) 03/18/2022   LDLDIRECT 161.7 06/14/2013   TRIG 86.0 03/18/2022   CHOLHDL 5 03/18/2022    Lab Results  Component Value Date   HGBA1C 5.0 11/09/2022    Radiology Reports No results found.    Signature  -   Susa Raring M.D on 11/11/2022 at 11:41 AM   -  To page go to [email or web address]

## 2022-11-11 NOTE — Evaluation (Signed)
Physical Therapy Evaluation Patient Details Name: Brittany [name] MRN: [id number] DOB: [birth year] Today's Date: 11/11/2022  History of Present Illness  57 y/o F admitted to [facility] on 6/18 for nausea, vomiting, dizziness, chest pressure vs indigestion for 2-3 days, admitted for hyponatremia. PMHx: HTN DM2, HLD, GERD, OA, anxiety, glaucoma, recent covid infection.  Clinical Impression  Pt presents today functioning at or close to her mobility baseline. Pt able to perform bed mobility, transfers, and ambulation independently, no AD, no SOB or fatigue, no LOB. Pt reports mobilizing in the room, feeling like she is at her baseline, declining any concerns about mobility upon discharge. Pt has no further benefit from skilled acute PT at this time, no current need for PT upon discharge. Will sign off.        Recommendations for follow up therapy are one component of a multi-disciplinary discharge planning process, led by the attending physician.  Recommendations may be updated based on patient status, additional functional criteria and insurance authorization.  Follow Up Recommendations       Assistance Recommended at Discharge None  Patient can return home with the following       Equipment Recommendations None recommended by PT  Recommendations for Other Services       Functional Status Assessment Patient has not had a recent decline in their functional status     Precautions / Restrictions Precautions Precautions: Other (comment) Precaution Comments: COVID precautions Restrictions Weight Bearing Restrictions: No      Mobility  Bed Mobility Overal bed mobility: Independent                  Transfers Overall transfer level: Independent Equipment used: None                    Ambulation/Gait Ambulation/Gait assistance: Independent Gait Distance (Feet): 250 Feet Assistive device: None Gait Pattern/deviations: WFL(Within Functional Limits) Gait velocity:  mildly decreased     General Gait Details: stable gait, no LOB, step through pattern  Stairs            Wheelchair Mobility    Modified Rankin (Stroke Patients Only)       Balance Overall balance assessment: Independent                                           Pertinent Vitals/Pain Pain Assessment Pain Assessment: No/denies pain    Home Living Family/patient expects to be discharged to:: Private residence Living Arrangements: Spouse/significant other Available Help at Discharge: Family;Available 24 hours/day Type of Home: House Home Access: Stairs to enter Entrance Stairs-Rails: Right;Left;Can reach both Entrance Stairs-Number of Steps: 3   Home Layout: One level Home Equipment: Grab bars - tub/shower;Shower seat - built in      Prior Function Prior Level of Function : Independent/Modified Independent;Driving;Working/employed             Mobility Comments: independent with all mobility, no falls, drives, works full time ADLs Comments: independent     Hand Dominance   Dominant Hand: Right    Extremity/Trunk Assessment   Upper Extremity Assessment Upper Extremity Assessment: Overall WFL for tasks assessed    Lower Extremity Assessment Lower Extremity Assessment: Overall WFL for tasks assessed    Cervical / Trunk Assessment Cervical / Trunk Assessment: Normal  Communication   Communication: No difficulties  Cognition Arousal/Alertness: Awake/alert Behavior During Therapy: WFL for tasks assessed/performed  Overall Cognitive Status: Within Functional Limits for tasks assessed                                 General Comments: A&Ox4, pleasant throughout session        General Comments General comments (skin integrity, edema, etc.): VSS on room air, no complaints of SOB, fatigue, or dizziness    Exercises     Assessment/Plan    PT Assessment Patient does not need any further PT services  PT Problem List          PT Treatment Interventions      PT Goals (Current goals can be found in the Care Plan section)  Acute Rehab PT Goals Patient Stated Goal: go home PT Goal Formulation: All assessment and education complete, DC therapy    Frequency       Co-evaluation               AM-PAC PT "6 Clicks" Mobility  Outcome Measure Help needed turning from your back to your side while in a flat bed without using bedrails?: None Help needed moving from lying on your back to sitting on the side of a flat bed without using bedrails?: None Help needed moving to and from a bed to a chair (including a wheelchair)?: None Help needed standing up from a chair using your arms (e.g., wheelchair or bedside chair)?: None Help needed to walk in hospital room?: None Help needed climbing 3-5 steps with a railing? : None 6 Click Score: 24    End of Session Equipment Utilized During Treatment: Gait belt Activity Tolerance: Patient tolerated treatment well Patient left: in bed;with call bell/phone within reach Nurse Communication: Mobility status PT Visit Diagnosis: Other abnormalities of gait and mobility (R26.89)    Time: 4696-2952 PT Time Calculation (min) (ACUTE ONLY): 13 min   Charges:   PT Evaluation $PT Eval Low Complexity: 1 Low          Lindalou Hose, PT DPT Acute Rehabilitation Services Office [phone]   Leonie Man 11/11/2022, 3:32 PM

## 2022-11-11 NOTE — Progress Notes (Signed)
eLink Physician-Brief Progress Note Patient Name: Brittany [name] DOB: [birth year] MRN: [id number]   Date of Service  11/11/2022  HPI/Events of Note  Serum sodium trending downwards.  eICU Interventions  D 5 % water gtt discontinued.        Thomasene Lot Terrie Haring 11/11/2022, 5:31 AM

## 2022-11-11 NOTE — Progress Notes (Signed)
Sodium lab resutl=119. Critical Care notified via Elink [phone]). Awaiting response. Will continue to monitor pt.

## 2022-11-12 ENCOUNTER — Other Ambulatory Visit (HOSPITAL_COMMUNITY): Payer: Self-pay

## 2022-11-12 DIAGNOSIS — E871 Hypo-osmolality and hyponatremia: Secondary | ICD-10-CM | POA: Diagnosis not present

## 2022-11-12 LAB — CBC WITH DIFFERENTIAL/PLATELET
Abs Immature Granulocytes: 0.01 10*3/uL (ref 0.00–0.07)
Basophils Absolute: 0 10*3/uL (ref 0.0–0.1)
Basophils Relative: 1 %
Eosinophils Absolute: 0.2 10*3/uL (ref 0.0–0.5)
Eosinophils Relative: 3 %
HCT: 35.5 % — ABNORMAL LOW (ref 36.0–46.0)
Hemoglobin: 12.2 g/dL (ref 12.0–15.0)
Immature Granulocytes: 0 %
Lymphocytes Relative: 49 %
Lymphs Abs: 2.2 10*3/uL (ref 0.7–4.0)
MCH: 29.5 pg (ref 26.0–34.0)
MCHC: 34.4 g/dL (ref 30.0–36.0)
MCV: 85.7 fL (ref 80.0–100.0)
Monocytes Absolute: 0.5 10*3/uL (ref 0.1–1.0)
Monocytes Relative: 10 %
Neutro Abs: 1.7 10*3/uL (ref 1.7–7.7)
Neutrophils Relative %: 37 %
Platelets: 239 10*3/uL (ref 150–400)
RBC: 4.14 MIL/uL (ref 3.87–5.11)
RDW: 12.9 % (ref 11.5–15.5)
WBC: 4.6 10*3/uL (ref 4.0–10.5)
nRBC: 0 % (ref 0.0–0.2)

## 2022-11-12 LAB — BASIC METABOLIC PANEL
Anion gap: 9 (ref 5–15)
Anion gap: 8 (ref 5–15)
Anion gap: 9 (ref 5–15)
BUN: 15 mg/dL (ref 6–20)
BUN: 15 mg/dL (ref 6–20)
BUN: 14 mg/dL (ref 6–20)
CO2: 19 mmol/L — ABNORMAL LOW (ref 22–32)
CO2: 23 mmol/L (ref 22–32)
CO2: 23 mmol/L (ref 22–32)
Calcium: 8.5 mg/dL — ABNORMAL LOW (ref 8.9–10.3)
Calcium: 9.5 mg/dL (ref 8.9–10.3)
Calcium: 9.5 mg/dL (ref 8.9–10.3)
Chloride: 82 mmol/L — ABNORMAL LOW (ref 98–111)
Chloride: 98 mmol/L (ref 98–111)
Chloride: 99 mmol/L (ref 98–111)
Creatinine, Ser: 0.95 mg/dL (ref 0.44–1.00)
Creatinine, Ser: 1.01 mg/dL — ABNORMAL HIGH (ref 0.44–1.00)
Creatinine, Ser: 0.96 mg/dL (ref 0.44–1.00)
GFR, Estimated: >60 mL/min (ref >60)
GFR, Estimated: >60 mL/min (ref >60)
GFR, Estimated: >60 mL/min (ref >60)
Glucose, Bld: 699 mg/dL (ref 70–99)
Glucose, Bld: 120 mg/dL — ABNORMAL HIGH (ref 70–99)
Glucose, Bld: 110 mg/dL — ABNORMAL HIGH (ref 70–99)
Potassium: 3.9 mmol/L (ref 3.5–5.1)
Potassium: 4.8 mmol/L (ref 3.5–5.1)
Potassium: 4.8 mmol/L (ref 3.5–5.1)
Sodium: 110 mmol/L — CL (ref 135–145)
Sodium: 129 mmol/L — ABNORMAL LOW (ref 135–145)
Sodium: 131 mmol/L — ABNORMAL LOW (ref 135–145)

## 2022-11-12 LAB — MAGNESIUM: Magnesium: 1.9 mg/dL (ref 1.7–2.4)

## 2022-11-12 LAB — BRAIN NATRIURETIC PEPTIDE: B Natriuretic Peptide: 8 pg/mL (ref 0.0–100.0)

## 2022-11-12 LAB — GLUCOSE, CAPILLARY: Glucose-Capillary: 120 mg/dL — ABNORMAL HIGH (ref 70–99)

## 2022-11-12 MED ORDER — CARVEDILOL 3.125 MG PO TABS
3.1250 mg | ORAL_TABLET | Freq: Two times a day (BID) | ORAL | 0 refills | Status: DC
  Filled 2022-11-12: qty 60, 30d supply, fill #0

## 2022-11-12 NOTE — Evaluation (Signed)
Occupational Therapy Evaluation Patient Details Name: Brittany [name] MRN: [id number] DOB: 12/27/1965 Today's Date: 11/12/2022   History of Present Illness 57 y/o F admitted to [facility] on 6/18 for nausea, vomiting, dizziness, chest pressure vs indigestion for 2-3 days, admitted for hyponatremia. PMHx: HTN DM2, HLD, GERD, OA, anxiety, glaucoma, recent covid infection.   Clinical Impression   At baseline, pt works, drives, and is Independent with all ADLs, IADLs, and functional transfers/mobility without an AD. Pt now presents with mildly decreased activity tolerance but within 95% of her baseline PLOF. Pt demonstrates ability to complete all ADLs and functional transfers/mobility without an AD Independent to Mod I (mildly increased time) with no SOB or LOB. Pt presents with good safety awareness and good health literacy. No further benefit from acute skilled OT services at this time. No post acute OT follow up is indicated at this time. OT is signing off.      Recommendations for follow up therapy are one component of a multi-disciplinary discharge planning process, led by the attending physician.  Recommendations may be updated based on patient status, additional functional criteria and insurance authorization.   Assistance Recommended at Discharge PRN (PRN assist for higher level home management tasks secondary to mildly decreased activity tolernace at this time.)  Patient can return home with the following      Functional Status Assessment  Patient has had a recent decline in their functional status and demonstrates the ability to make significant improvements in function in a reasonable and predictable amount of time. (Pt now within 95% of baseline with no benefit from skilled OT services at this time.)  Equipment Recommendations  None recommended by OT    Recommendations for Other Services       Precautions / Restrictions Precautions Precautions: None Precaution Comments: COVID  precautions lifted Restrictions Weight Bearing Restrictions: No      Mobility Bed Mobility Overal bed mobility: Independent                  Transfers Overall transfer level: Independent Equipment used: None                      Balance Overall balance assessment: Independent                                         ADL either performed or assessed with clinical judgement   ADL Overall ADL's : Modified independent;Independent                                       General ADL Comments: Pt with mildly decreased activity tolerance but demonstrating ability to safely complete all ADLs and functional transfer/mobility without an AD Independent to Mod I (increased time). Pt within 95% of baseline PLOF. OT educated pt in energy conservation strategies this session to increase safety upon discharge/return to work with pt verbalizing understanding of all training.     Vision Baseline Vision/History: 1 Wears glasses (Progressives) Ability to See in Adequate Light: 0 Adequate (with glasses) Patient Visual Report: Other (comment) (Pt with new difficulty reading, using computer, and seeing clearly with glasses a few days leading up to and at time of [facility] admission. Pt reports vision now clearer with pt able to read/wear glasses comfortablly. Pt reports vision now very near baseline.)  Vision Assessment?: No apparent visual deficits     Perception     Praxis Praxis Praxis tested?: Within functional limits    Pertinent Vitals/Pain Pain Assessment Pain Assessment: No/denies pain     Hand Dominance Right   Extremity/Trunk Assessment Upper Extremity Assessment Upper Extremity Assessment: Overall WFL for tasks assessed   Lower Extremity Assessment Lower Extremity Assessment: Overall WFL for tasks assessed   Cervical / Trunk Assessment Cervical / Trunk Assessment: Normal   Communication Communication Communication: No difficulties    Cognition Arousal/Alertness: Awake/alert Behavior During Therapy: WFL for tasks assessed/performed Overall Cognitive Status: Within Functional Limits for tasks assessed                                 General Comments: A&Ox4, pleasant throughout session, good safety awarenss, good insight into current medical condition     General Comments  Pt VSS on RA throughout skilled OT eval. Pt reports no dizziness or lightheadness and no SOB or LOB noted throughout session.    Exercises     Shoulder Instructions      Home Living Family/patient expects to be discharged to:: Private residence Living Arrangements: Spouse/significant other Available Help at Discharge: Family;Available 24 hours/day Type of Home: House Home Access: Stairs to enter [employer] of Steps: 3 Entrance Stairs-Rails: Right;Left;Can reach both Home Layout: One level     Bathroom Shower/Tub: Walk-in shower;Tub/shower unit   Bathroom Toilet: Handicapped height Bathroom Accessibility: Yes How Accessible: Accessible via walker Home Equipment: Grab bars - tub/shower;Shower seat - built in          Prior Functioning/Environment Prior Level of Function : Independent/Modified Independent;Driving;Working/employed             Mobility Comments: independent with all mobility, no falls, drives, works full time ADLs Comments: independent with all ADLs and IADLs        OT Problem List:        OT Treatment/Interventions:      OT Goals(Current goals can be found in the care plan section) Acute Rehab OT Goals Patient Stated Goal: To return to work and get back to feeling 100% herself OT Goal Formulation: With patient  OT Frequency:      Co-evaluation              AM-PAC OT "6 Clicks" Daily Activity     Outcome Measure Help from another person eating meals?: None Help from another person taking care of personal grooming?: None Help from another person toileting, which includes  using toliet, bedpan, or urinal?: None Help from another person bathing (including washing, rinsing, drying)?: None Help from another person to put on and taking off regular upper body clothing?: None Help from another person to put on and taking off regular lower body clothing?: None 6 Click Score: 24   End of Session Nurse Communication: Mobility status;Other (comment) (OT signing off)  Activity Tolerance: Patient tolerated treatment well Patient left: in chair;with call bell/phone within reach                   Time: 0927-0943 OT Time Calculation (min): 16 min Charges:  OT General Charges $OT Visit: 1 Visit OT Evaluation $OT Eval Low Complexity: 1 Low  Serapio Edelson "Orson Eva., OTR/L, MA Acute Rehab [phone]   Lendon Colonel 11/12/2022, 9:56 AM

## 2022-11-12 NOTE — Discharge Summary (Signed)
Brittany [name] ZOX:[id number] DOB: Nov 19, 1965 DOA: 11/08/2022  PCP: Nelwyn Salisbury, MD  Admit date: 11/08/2022  Discharge date: 11/12/2022  Admitted From: Home   Disposition:  Home   Recommendations for Outpatient Follow-up:   Follow up with PCP in 1-2 weeks  PCP Please obtain BMP/CBC, 2 view CXR in 1week,  (see Discharge instructions)   PCP Please follow up on the following pending results: Monitor blood pressure and BMP closely   Home Health: None   Equipment/Devices: None  Consultations: PCCM, Renal Discharge Condition: Stable    CODE STATUS: Full    Diet Recommendation: Heart Healthy, 1.5 L fluid restriction per day   Chief Complaint  Patient presents with   Emesis   Chest Pain     Brief history of present illness from the day of admission and additional interim summary    57 y.o. female who has a PMH as below. She presented to [facility] 6/18 with nausea, vomiting, dizziness, chest pressure vs indigestion x 2 - 3 days. She was diagnosed with COVID 6/12 and started on Molnupiravir. States she started taking this and experienced worsening of nausea and vomiting which was only intermittent/mild prior to starting therapy. She called PCP 6/18 and was prescribed Zofran but was not able to pick up due to severity of symptoms; therefore, went to ED. She has not had much to eat or drink in past 2 days due to not being able to keep anything down. She was also just recently started on Lisinopril-hydrochlorothiazide on  10/27/21 after being off BP meds since Oct 2023 (Lotensin).   In ED, she was found to have Na of 107. She was started on NS and repeat 1 hour later was 111. Besides nausea/vomiting, dizziness, she had not had any additional symptoms. Denies any confusion, weakness, AMS, syncope, seizures.  Due to  severity of her hyponatremia, PCCM called for admission to ICU for closer monitoring, she was kept in the ICU transferred to the hospitalist service on 11/08/2022.  In the ICU there was a brief episode of rapid correction which was corrected by D5W.                                                                 Hospital Course   Severe symptomatic hyponatremia.  Appears to be consistent with hypovolemia caused by HCTZ use with some element of excessive free water intake.  HCTZ discontinued, initially was in ICU, seen by nephrology, case discussed with nephrologist Dr. Ronalee Belts in detail on 11/12/2022, her sodium levels have improved considerably, she has been requested to strictly monitor free water restriction of 1.5 L/day and to discontinue on HCTZ, she is completely symptom-free will be discharged with outpatient follow-up with PCP within a week for repeat CMP check.   Recent episode of nausea vomiting.  Side effect of her antiviral medication she was given, resolved.   Recent diagnosis of COVID-19.  Asymptomatic and finished isolation on 11/11/2022.   Anemia of chronic disease.  Stable.   Hypertension.  Placed on Coreg.  Discontinued ACE/HCTZ combination due to severe hyponatremia.    Discharge diagnosis     Principal Problem:   Hyponatremia    Discharge instructions    Discharge Instructions     Discharge instructions   Complete by: As directed    Follow with Primary MD Nelwyn Salisbury, MD in 7 days   Get CBC, CMP  -  checked next visit with your primary MD    Activity: As tolerated with Full fall precautions use walker/cane & assistance as needed  Disposition Home    Diet: Heart Healthy, 1.5 L total fluid restriction per day  Special Instructions: If you have smoked or chewed Tobacco  in the last 2 yrs please stop smoking, stop any regular Alcohol  and or any Recreational drug use.  On your next visit with your primary care physician please Get Medicines reviewed and  adjusted.  Please request your Prim.MD to go over all Hospital Tests and Procedure/Radiological results at the follow up, please get all Hospital records sent to your Prim MD by signing hospital release before you go home.  If you experience worsening of your admission symptoms, develop shortness of breath, life threatening emergency, suicidal or homicidal thoughts you must seek medical attention immediately by calling 911 or calling your MD immediately  if symptoms less severe.  You Must read complete instructions/literature along with all the possible adverse reactions/side effects for all the Medicines you take and that have been prescribed to you. Take any new Medicines after you have completely understood and accpet all the possible adverse reactions/side effects.   Increase activity slowly   Complete by: As directed        Discharge Medications   Allergies as of 11/12/2022   No Known Allergies      Medication List     STOP taking these medications    amoxicillin-clavulanate 500-125 MG tablet Commonly known as: Augmentin   lisinopril-hydrochlorothiazide 20-25 MG tablet Commonly known as: ZESTORETIC       TAKE these medications    aspirin 81 MG chewable tablet Chew 1 tablet (81 mg total) by mouth daily.   bimatoprost 0.01 % Soln Commonly known as: LUMIGAN Place 1 drop into both eyes at bedtime.   bismuth subsalicylate 262 MG/15ML suspension Commonly known as: PEPTO BISMOL Take 30 mLs by mouth every 6 (six) hours as needed for indigestion. Patient was using up to 3-4 times daily depending on upset stomach. Patient used a bottle and a half in 2 days last week.   blood glucose meter kit and supplies Kit Dispense based on patient and insurance preference. Use up to four times daily as directed. (FOR ICD-9 250.00, 250.01).   carvedilol 3.125 MG tablet Commonly known as: COREG Take 1 tablet (3.125 mg total) by mouth 2 (two) times daily with a meal.   ezetimibe 10 MG  tablet Commonly known as: ZETIA Take 1 tablet (10 mg total) by mouth daily.   LORazepam 2 MG tablet Commonly known as: ATIVAN TAKE 1 TABLET BY MOUTH EVERY 8  HOURS AS NEEDED FOR ANXIETY What changed: reasons to take this   metFORMIN 1000 MG tablet Commonly known as: GLUCOPHAGE TAKE ONE-HALF TABLET BY MOUTH  TWICE DAILY WITH MEALS What changed: See the new instructions.   omeprazole  40 MG capsule Commonly known as: PRILOSEC Take 1 capsule (40 mg total) by mouth daily.   ondansetron 8 MG tablet Commonly known as: Zofran Take 1 tablet (8 mg total) by mouth every 6 (six) hours as needed for nausea or vomiting.   OneTouch Delica Lancets Fine Misc TEST ONCE A DAY   OneTouch Verio test strip Generic drug: glucose blood TEST ONCE DAILY   Tylenol 8 Hour Arthritis Pain 650 MG CR tablet Generic drug: acetaminophen Take 1,300 mg by mouth as needed for pain. Patient used once to twice daily depending on pain.   VITAMIN D-3 PO Take 1 capsule by mouth daily.         Follow-up Information     Nelwyn Salisbury, MD. Schedule an appointment as soon as possible for a visit in 1 week(s).   Specialty: Family Medicine Contact information: [address] [place] [address] [phone]                 Major procedures and Radiology Reports - PLEASE review detailed and final reports thoroughly  -       No results found.  Micro Results    Recent Results (from the past 240 hour(s))  MRSA Next Gen by PCR, Nasal     Status: None   Collection Time: 11/09/22  1:35 AM   Specimen: Nasal Mucosa; Nasal Swab  Result Value Ref Range Status   MRSA by PCR Next Gen NOT DETECTED NOT DETECTED Final    Comment: (NOTE) The GeneXpert MRSA Assay (FDA approved for NASAL specimens only), is one component of a comprehensive MRSA colonization surveillance program. It is not intended to diagnose MRSA infection nor to guide or monitor treatment for MRSA infections. Test performance  is not FDA approved in patients less than 10 years old. Performed at [facility], [address], [place], [place] [address]     Today   Subjective    Brittany [name] today has no headache,no chest abdominal pain,no new weakness tingling or numbness, feels much better wants to go home today.    Objective   Blood pressure 107/82, pulse 68, temperature (!) 97.2 F (36.2 C), temperature source Oral, resp. rate 18, height 5' 7.72" (1.72 m), weight 62.4 kg, SpO2 100 %.   Intake/Output Summary (Last 24 hours) at 11/12/2022 1016 Last data filed at 11/11/2022 1800 Gross per 24 hour  Intake 342.53 ml  Output --  Net 342.53 ml    Exam  Awake Alert, No new F.N deficits,    [place].AT,PERRAL Supple Neck,   Symmetrical Chest wall movement, Good air movement bilaterally, CTAB RRR,No Gallops,   +ve B.Sounds, Abd Soft, Non tender,  No Cyanosis, Clubbing or edema    Data Review   Recent Labs  Lab 11/08/22 2211 11/09/22 0257 11/12/22 0402  WBC 3.8* 3.9* 4.6  HGB 12.1 11.8* 12.2  HCT 32.6* 31.9* 35.5*  PLT 225 213 239  MCV 80.5 80.4 85.7  MCH 29.9 29.7 29.5  MCHC 37.1* 37.0* 34.4  RDW 12.1 11.9 12.9  LYMPHSABS  --   --  2.2  MONOABS  --   --  0.5  EOSABS  --   --  0.2  BASOSABS  --   --  0.0    Recent Labs  Lab 11/08/22 2211 11/08/22 2307 11/09/22 0257 11/09/22 6301 11/10/22 0642 11/10/22 1051 11/11/22 0749 11/11/22 1002 11/11/22 1007 11/11/22 1528 11/11/22 2252 11/12/22 0130 11/12/22 0402  NA 107*   < >  111*   < > 123*   < > 124*  --  129* 131* 110* 129* 131*  K 4.1   < > 3.6  --  3.9  --  4.4  --   --  5.1 3.9 4.8 4.8  CL 75*   < > 79*  --  93*  --  91*  --   --  95* 82* 98 99  CO2 19*   < > 21*  --  22  --  23  --   --  21* 19* 23 23  ANIONGAP 13   < > 11  --  8  --  10  --   --  15 9 8 9   GLUCOSE 163*   < > 121*  --  114*  --  110*  --   --  102* 699* 120* 110*  BUN 9   < > 7  --  8  --  11  --   --  15 15 15 14   CREATININE 0.87   < > 0.86  --   1.00  --  1.05*  --   --  1.17* 0.95 1.01* 0.96  AST 33  --   --   --  24  --   --   --   --   --   --   --   --   ALT 20  --   --   --  23  --   --   --   --   --   --   --   --   ALKPHOS 47  --   --   --  49  --   --   --   --   --   --   --   --   BILITOT 0.6  --   --   --  0.5  --   --   --   --   --   --   --   --   ALBUMIN 4.7  --   --   --  3.9  --   --   --   --   --   --   --   --   TSH  --   --   --   --   --   --   --  2.651  --   --   --   --   --   HGBA1C  --   --  5.0  --   --   --   --   --   --   --   --   --   --   BNP  --   --   --   --   --   --  18.8  --   --   --   --   --  8.0  MG 1.4*  --  2.1  --  1.9  --  1.9  --   --   --   --   --  1.9  CALCIUM 9.1   < > 8.6*  --  9.0  --  9.5  --   --  10.0 8.5* 9.5 9.5   < > = values in this interval not displayed.    Total Time in preparing paper work, data evaluation and todays exam - 35 minutes  Signature  -    Susa Raring M.D on 11/12/2022 at  10:16 AM   -  To page go to [email or web address]

## 2022-11-12 NOTE — Progress Notes (Signed)
At 2252 blood sample for BMP was taken. At 2359 lab called for critical result Na:110 and CBG: 669. Informed lab that they need to re-draw blood as it seems unexpected. At 0130 they did collect new sample (Na: 129 and CBG: 120). Pierre Bali, DO made aware. Advised to take next BMP at 0500.

## 2022-11-12 NOTE — Progress Notes (Signed)
[facility] NEPHROLOGY PROGRESS NOTE  Assessment/ Plan: Pt is a 57 y.o. yo female with history of hypertension, recent COVID infection treated with molnupiravir, presented with nausea vomiting and found to have severe hyponatremia of 107.  # Severe hyponatremia on presentation due to combination of GI loss, recent use of HCTZ and excessive intake of free water. The patient was initially treated with NS, 3% saline in ICU leading to overcorrection.  She required D5W to correct the sodium level. Now her sodium level is improved 131 this morning.  Clinically improved without any sign or symptoms.  Able to eat. Discontinue HCTZ or any thiazide diuretics permanently. No need for IV fluid as he is able to take orally. Recommend to check BMP in a week with PCP.  # Hypertension: Can consider ACE inhibitor or ARB however do not restart HCTZ.  She will have to follow-up with her PCP.  # Episodes of nausea and vomiting presumably due to recent COVID infection and antiviral medication.  Clinically improved now.  Discussed with the patient and the primary team. Please call us back with question.  Subjective: Seen and examined at bedside.  Patient states she feels good without any complaint or concern.  Able to to eat.  Denies nausea and vomiting. Objective Vital signs in last 24 hours: Vitals:   11/11/22 2013 11/11/22 2338 11/12/22 0440 11/12/22 0831  BP: 115/76 116/82 106/70 107/82  Pulse:   74 68  Resp:   17 18  Temp: 97.7 F (36.5 C) (!) 97.5 F (36.4 C) 97.8 F (36.6 C) (!) 97.2 F (36.2 C)  TempSrc: Oral Oral Oral Oral  SpO2:    100%  Weight:      Height:       Weight change:   Intake/Output Summary (Last 24 hours) at 11/12/2022 1025 Last data filed at 11/11/2022 1800 Gross per 24 hour  Intake 342.53 ml  Output --  Net 342.53 ml       Labs: RENAL PANEL Recent Labs  Lab 11/08/22 2211 11/08/22 2307 11/09/22 0257 11/09/22 0642 11/10/22 0642 11/10/22 1051  11/11/22 0749 11/11/22 1007 11/11/22 1528 11/11/22 2252 11/12/22 0130 11/12/22 0402  NA 107*   < > 111*   < > 123*   < > 124* 129* 131* 110* 129* 131*  K 4.1   < > 3.6  --  3.9  --  4.4  --  5.1 3.9 4.8 4.8  CL 75*   < > 79*  --  93*  --  91*  --  95* 82* 98 99  CO2 19*   < > 21*  --  22  --  23  --  21* 19* 23 23  GLUCOSE 163*   < > 121*  --  114*  --  110*  --  102* 699* 120* 110*  BUN 9   < > 7  --  8  --  11  --  15 15 15 14   CREATININE 0.87   < > 0.86  --  1.00  --  1.05*  --  1.17* 0.95 1.01* 0.96  CALCIUM 9.1   < > 8.6*  --  9.0  --  9.5  --  10.0 8.5* 9.5 9.5  MG 1.4*  --  2.1  --  1.9  --  1.9  --   --   --   --  1.9  PHOS  --   --  1.7*  --  3.6  --   --   --   --   --   --   --  ALBUMIN 4.7  --   --   --  3.9  --   --   --   --   --   --   --    < > = values in this interval not displayed.    Liver Function Tests: Recent Labs  Lab 11/08/22 2211 11/10/22 0642  AST 33 24  ALT 20 23  ALKPHOS 47 49  BILITOT 0.6 0.5  PROT 7.5 6.8  ALBUMIN 4.7 3.9   Recent Labs  Lab 11/08/22 2211  LIPASE 141*   No results for input(s): "AMMONIA" in the last 168 hours. CBC: Recent Labs    03/18/22 0900 11/08/22 2211 11/09/22 0257 11/12/22 0402  HGB 11.8* 12.1 11.8* 12.2  MCV 89.5 80.5 80.4 85.7    Cardiac Enzymes: No results for input(s): "CKTOTAL", "CKMB", "CKMBINDEX", "TROPONINI" in the last 168 hours. CBG: Recent Labs  Lab 11/11/22 0830 11/11/22 1247 11/11/22 1641 11/11/22 2120 11/12/22 0834  GLUCAP 98 103* 83 130* 120*    Iron Studies: No results for input(s): "IRON", "TIBC", "TRANSFERRIN", "FERRITIN" in the last 72 hours. Studies/Results: No results found.  Medications: Infusions:   Scheduled Medications:  amoxicillin-clavulanate  1 tablet Oral Q12H   aspirin  81 mg Oral Daily   carvedilol  3.125 mg Oral BID WC   Chlorhexidine Gluconate Cloth  6 each Topical Q0600   enoxaparin (LOVENOX) injection  40 mg Subcutaneous Daily   fluticasone  1 spray  Each Nare Daily   insulin aspart  0-15 Units Subcutaneous TID WC   latanoprost  1 drop Both Eyes QHS   pantoprazole  40 mg Oral Daily    have reviewed scheduled and prn medications.  Physical Exam: General:NAD, comfortable Heart:RRR, s1s2 nl Lungs:clear b/l, no crackle Abdomen:soft, Non-tender, non-distended Extremities:No edema Neurology: Alert, awake and following commands.  Mehul Rudin Prasad Wassim Kirksey 11/12/2022,10:25 AM  LOS: 3 days

## 2022-11-12 NOTE — Discharge Instructions (Signed)
Follow with Primary MD Nelwyn Salisbury, MD in 7 days   Get CBC, CMP  -  checked next visit with your primary MD    Activity: As tolerated with Full fall precautions use walker/cane & assistance as needed  Disposition Home    Diet: Heart Healthy, 1.5 L total fluid restriction per day  Special Instructions: If you have smoked or chewed Tobacco  in the last 2 yrs please stop smoking, stop any regular Alcohol  and or any Recreational drug use.  On your next visit with your primary care physician please Get Medicines reviewed and adjusted.  Please request your Prim.MD to go over all Hospital Tests and Procedure/Radiological results at the follow up, please get all Hospital records sent to your Prim MD by signing hospital release before you go home.  If you experience worsening of your admission symptoms, develop shortness of breath, life threatening emergency, suicidal or homicidal thoughts you must seek medical attention immediately by calling 911 or calling your MD immediately  if symptoms less severe.  You Must read complete instructions/literature along with all the possible adverse reactions/side effects for all the Medicines you take and that have been prescribed to you. Take any new Medicines after you have completely understood and accpet all the possible adverse reactions/side effects.

## 2022-11-13 LAB — UREA NITROGEN, URINE: Urea Nitrogen, Ur: 174 mg/dL

## 2022-11-14 ENCOUNTER — Telehealth: Payer: Self-pay

## 2022-11-14 NOTE — Transitions of Care (Post Inpatient/ED Visit) (Signed)
11/14/2022  Name: Brittany [name] MRN: [id number] DOB: 1965-05-27  Today's TOC FU Call Status: Today's TOC FU Call Status:: Successful TOC FU Call Competed TOC FU Call Complete Date: 11/14/22  Transition Care Management Follow-up Telephone Call Date of Discharge: 11/12/22 Discharge Facility: [facility]) Type of Discharge: Inpatient Admission Primary Inpatient Discharge Diagnosis:: Hyponatremia How have you been since you were released from the hospital?: Better Any questions or concerns?: No  Items Reviewed: Did you receive and understand the discharge instructions provided?: Yes Medications obtained,verified, and reconciled?: Yes (Medications Reviewed) Any new allergies since your discharge?: No Dietary orders reviewed?: Yes Do you have support at home?: Yes  Medications Reviewed Today: Medications Reviewed Today     Reviewed by Merleen Nicely, LPN (Licensed Practical Nurse) on 11/14/22 at 1028  Med List Status: <None>   Medication Order Taking? Sig Documenting Provider Last Dose Status Informant  acetaminophen (TYLENOL 8 HOUR ARTHRITIS PAIN) 650 MG CR tablet [id number] Yes Take 1,300 mg by mouth as needed for pain. Patient used once to twice daily depending on pain. [provider] Taking Active Self  aspirin 81 MG chewable tablet [id number] Yes Chew 1 tablet (81 mg total) by mouth daily. Arby Barrette, MD Taking Active Self  bimatoprost (LUMIGAN) 0.01 % SOLN [id number] Yes Place 1 drop into both eyes at bedtime. [provider] Taking Active Self  bismuth subsalicylate (PEPTO BISMOL) 262 MG/15ML suspension [id number] Yes Take 30 mLs by mouth every 6 (six) hours as needed for indigestion. Patient was using up to 3-4 times daily depending on upset stomach. Patient used a bottle and a half in 2 days last week. [provider] Taking Active Self  blood glucose meter kit and supplies KIT [id number] Yes Dispense based on patient and insurance  preference. Use up to four times daily as directed. (FOR ICD-9 250.00, 250.01). Nelwyn Salisbury, MD Taking Active Self  carvedilol (COREG) 3.125 MG tablet [id number] Yes Take 1 tablet (3.125 mg total) by mouth 2 (two) times daily with a meal. Leroy Sea, MD Taking Active            Med Note Julieanne Manson, Kassidi Elza H   Mon Nov 14, 2022 10:27 AM) To take if systolic > 110   Cholecalciferol (VITAMIN D-3 PO) [id number] Yes Take 1 capsule by mouth daily. [provider] Taking Active Self  ezetimibe (ZETIA) 10 MG tablet [id number] No Take 1 tablet (10 mg total) by mouth daily.  Patient not taking: Reported on 11/14/2022   Nelwyn Salisbury, MD Not Taking Active Self  glucose blood [facility] VERIO) test strip [id number] Yes TEST ONCE DAILY Nelwyn Salisbury, MD Taking Active Self  LORazepam (ATIVAN) 2 MG tablet [id number] Yes TAKE 1 TABLET BY MOUTH EVERY 8  HOURS AS NEEDED FOR ANXIETY  Patient taking differently: Take 2 mg by mouth every 8 (eight) hours as needed for anxiety. for anxiety   Nelwyn Salisbury, MD Taking Active Self  metFORMIN (GLUCOPHAGE) 1000 MG tablet [id number] Yes TAKE ONE-HALF TABLET BY MOUTH  TWICE DAILY WITH MEALS  Patient taking differently: Take 500 mg by mouth 2 (two) times daily with a meal.   Nelwyn Salisbury, MD Taking Active Self  omeprazole (PRILOSEC) 40 MG capsule [id number] Yes Take 1 capsule (40 mg total) by mouth daily. Nelwyn Salisbury, MD Taking Active Self  ondansetron [facility]) 8 MG tablet [id number] No Take 1 tablet (8 mg total) by mouth every 6 (six) hours as needed for nausea or vomiting.  Patient not taking: Reported on 11/14/2022   Nelwyn Salisbury, MD Not Taking Active Self           Med Note (CARD, AMY L   Wed Nov 09, 2022  3:05 PM) Patient hasn't picked up prescription.   [facility] DELICA LANCETS FINE MISC [id number] Yes TEST ONCE A DAY Nelwyn Salisbury, MD Taking Active Self            Home Care and Equipment/Supplies: Were Home Health Services Ordered?: No Any new  equipment or medical supplies ordered?: No  Functional Questionnaire: Do you need assistance with bathing/showering or dressing?: No Do you need assistance with meal preparation?: No Do you need assistance with eating?: No Do you have difficulty maintaining continence: No Do you need assistance with getting out of bed/getting out of a chair/moving?: No Do you have difficulty managing or taking your medications?: No  Follow up appointments reviewed: PCP Follow-up appointment confirmed?: Yes Date of PCP follow-up appointment?: 11/21/22 Follow-up Provider: Dr Clent Ridges [facility] Follow-up appointment confirmed?: No Do you need transportation to your follow-up appointment?: No    SIGNATURE  Woodfin Ganja LPN [facility] Nurse Health Advisor Direct Dial [phone]

## 2022-11-21 ENCOUNTER — Ambulatory Visit (INDEPENDENT_AMBULATORY_CARE_PROVIDER_SITE_OTHER): Payer: No Typology Code available for payment source | Admitting: Family Medicine

## 2022-11-21 ENCOUNTER — Encounter: Payer: Self-pay | Admitting: Family Medicine

## 2022-11-21 VITALS — BP 122/84 | HR 81 | Temp 98.1°F | Wt 141.0 lb

## 2022-11-21 DIAGNOSIS — E871 Hypo-osmolality and hyponatremia: Secondary | ICD-10-CM | POA: Diagnosis not present

## 2022-11-21 LAB — BASIC METABOLIC PANEL
BUN: 18 mg/dL (ref 6–23)
CO2: 28 mEq/L (ref 19–32)
Calcium: 10.6 mg/dL — ABNORMAL HIGH (ref 8.4–10.5)
Chloride: 102 mEq/L (ref 96–112)
Creatinine, Ser: 1.05 mg/dL (ref 0.40–1.20)
GFR: 58.51 mL/min — ABNORMAL LOW (ref >60.00)
Glucose, Bld: 91 mg/dL (ref 70–99)
Potassium: 4.5 mEq/L (ref 3.5–5.1)
Sodium: 139 mEq/L (ref 135–145)

## 2022-11-21 NOTE — Progress Notes (Signed)
   Subjective:    Patient ID: Brittany [name], female    DOB: June 09, 1965, 57 y.o.   MRN: [id number]  HPI Here for a transitional care visit to follow up after a hospital stay from 11-08-22 to 11-12-22 for hyponatremia. She was seen in our clinic on 11-02-22 for a Covid infection, and she was treated with Molnupiravir. The sinus congestion and ear pain resolved, but then she began to feel extremely weak, she felt dizzy so that it was hard for her to walk, she had trouble concentrating on what she was doing, and she developed nausea with vomiting. She presented to the ED on 11-08-22, and labs revealed a Na of 107 and a creatinine of 0.96. was admitted and given NS fluid. She had been taking Lisinopril HCT but this was discontinued. Over the next few days her Na slowly went up to 131 by her DC (this was 134 last October). She was prescribed Carvedilol for her BP but she has never taken this because her BP has been so low at home. She was also given a fluid restriction of 1.5 liters a day. Since going home, she feels a little better, and the nausea and vomiting has stopped. However she is still weak and has trouble with her balance. Her appetite is poor. All this has made her baseline anxiety much worse also, and she is taking Lorazepam occasionally for this.    Review of Systems  Constitutional:  Positive for fatigue. Negative for fever.  HENT: Negative.    Eyes: Negative.   Respiratory: Negative.    Cardiovascular: Negative.   Gastrointestinal: Negative.   Genitourinary: Negative.   Neurological:  Positive for dizziness. Negative for speech difficulty and headaches.  Psychiatric/Behavioral:  Positive for dysphoric mood. Negative for sleep disturbance. The patient is nervous/anxious.        Objective:   Physical Exam Constitutional:      Appearance: She is ill-appearing.  Cardiovascular:     Rate and Rhythm: Normal rate and regular rhythm.     Pulses: Normal pulses.     Heart sounds:  Normal heart sounds.  Pulmonary:     Effort: Pulmonary effort is normal.     Breath sounds: Normal breath sounds.  Abdominal:     General: Abdomen is flat. Bowel sounds are normal. There is no distension.     Palpations: Abdomen is soft. There is no mass.     Tenderness: There is no abdominal tenderness. There is no right CVA tenderness, guarding or rebound.     Hernia: No hernia is present.  Neurological:     Mental Status: She is alert and oriented to person, place, and time.     Comments: She is slightly unsteady on her feet   Psychiatric:        Behavior: Behavior normal.        Thought Content: Thought content normal.     Comments: Anxious and tearful            Assessment & Plan:  She is recovering from severe hyponatremia. We will check a BMET today. She will continue with the fluid restriction. I agreed that she should not take the Carvedilol for the time being since her BP is low. We have written her out of work from 11-08-22 until 12-19-22. We spent a total of ( 35  ) minutes reviewing records and discussing these issues.  Gershon Crane, MD

## 2022-11-25 ENCOUNTER — Encounter: Payer: Self-pay | Admitting: Family Medicine

## 2022-11-25 NOTE — Telephone Encounter (Signed)
She can take Ibuprofen as needed for headaches

## 2022-12-12 ENCOUNTER — Ambulatory Visit: Payer: No Typology Code available for payment source | Admitting: Family Medicine

## 2022-12-16 ENCOUNTER — Ambulatory Visit (INDEPENDENT_AMBULATORY_CARE_PROVIDER_SITE_OTHER): Payer: No Typology Code available for payment source | Admitting: Family Medicine

## 2022-12-16 ENCOUNTER — Encounter: Payer: Self-pay | Admitting: Family Medicine

## 2022-12-16 VITALS — BP 138/82 | HR 81 | Temp 98.0°F | Ht 67.72 in | Wt 149.4 lb

## 2022-12-16 DIAGNOSIS — I10 Essential (primary) hypertension: Secondary | ICD-10-CM | POA: Diagnosis not present

## 2022-12-16 DIAGNOSIS — E871 Hypo-osmolality and hyponatremia: Secondary | ICD-10-CM | POA: Diagnosis not present

## 2022-12-16 DIAGNOSIS — E782 Mixed hyperlipidemia: Secondary | ICD-10-CM | POA: Diagnosis not present

## 2022-12-16 DIAGNOSIS — F419 Anxiety disorder, unspecified: Secondary | ICD-10-CM | POA: Diagnosis not present

## 2022-12-16 LAB — LIPID PANEL
Cholesterol: 268 mg/dL — ABNORMAL HIGH (ref 0–200)
HDL: 70 mg/dL (ref >39.00)
LDL Cholesterol: 181 mg/dL — ABNORMAL HIGH (ref 0–99)
NonHDL: 197.96
Total CHOL/HDL Ratio: 4
Triglycerides: 86 mg/dL (ref 0.0–149.0)
VLDL: 17.2 mg/dL (ref 0.0–40.0)

## 2022-12-16 LAB — BASIC METABOLIC PANEL
BUN: 16 mg/dL (ref 6–23)
CO2: 29 mEq/L (ref 19–32)
Calcium: 9.9 mg/dL (ref 8.4–10.5)
Chloride: 101 mEq/L (ref 96–112)
Creatinine, Ser: 1.15 mg/dL (ref 0.40–1.20)
GFR: 52.43 mL/min — ABNORMAL LOW (ref >60.00)
Glucose, Bld: 96 mg/dL (ref 70–99)
Potassium: 3.5 mEq/L (ref 3.5–5.1)
Sodium: 140 mEq/L (ref 135–145)

## 2022-12-16 MED ORDER — ONDANSETRON HCL 8 MG PO TABS: 8.0000 mg | ORAL_TABLET | Freq: Four times a day (QID) | ORAL | 0 refills | Status: AC | PRN

## 2022-12-16 MED ORDER — ESCITALOPRAM OXALATE 10 MG PO TABS: 10.0000 mg | ORAL_TABLET | Freq: Every day | ORAL | 3 refills | Status: AC

## 2022-12-16 NOTE — Progress Notes (Signed)
   Subjective:    Patient ID: Brittany [name], female    DOB: [birth year], 57 y.o.   MRN: [id number]  HPI Here to follow up on hyponatremia and HTN. She was admitted on 11-08-22 with mental and physical issues, and her sodium was found to be very low at 107. She was treated with IV NS, and since going home she has been on fluid restrictions. The sodium was back in the normal range on 11-21-22 at 139. Her BP at home has been up and down. She feels better physcially, but she has been dealing with a lot of anxiety since her hospital stay. She cannot relax, she worries about things, she feels shaky, and she has trouble sleeping. She takes Lorazepam occasionally, but this makes her drowsy. She has found a therapist, and they have had 2 sessions so far.    Review of Systems  Constitutional:  Positive for fatigue.  Respiratory: Negative.    Cardiovascular: Negative.   Gastrointestinal: Negative.   Genitourinary: Negative.   Neurological:  Positive for light-headedness and headaches. Negative for dizziness.  Psychiatric/Behavioral:  Positive for decreased concentration and sleep disturbance. Negative for agitation, behavioral problems, confusion, dysphoric mood and hallucinations. The patient is nervous/anxious.        Objective:   Physical Exam Constitutional:      Appearance: Normal appearance.  Cardiovascular:     Rate and Rhythm: Normal rate and regular rhythm.     Pulses: Normal pulses.     Heart sounds: Normal heart sounds.  Pulmonary:     Effort: Pulmonary effort is normal.     Breath sounds: Normal breath sounds.  Abdominal:     General: Abdomen is flat. Bowel sounds are normal. There is no distension.     Palpations: Abdomen is soft. There is no mass.     Tenderness: There is no abdominal tenderness. There is no right CVA tenderness, left CVA tenderness, guarding or rebound.     Hernia: No hernia is present.  Musculoskeletal:     Right lower leg: No edema.     Left lower leg:  No edema.  Neurological:     General: No focal deficit present.     Mental Status: She is alert and oriented to person, place, and time.  Psychiatric:        Behavior: Behavior normal.        Thought Content: Thought content normal.     Comments: She is quite anxious            Assessment & Plan:  Her HTN has been somewhat controlled, I think her anxiety exacerbates this. For the hyponatremia, we will check another BMET today. For the anxiety, she will try Lexapro 10 mg daily and she will continue to work with her therapist. We will fill out disability forms to cover her from 11-08-22 until she returns to work on 01-09-23. Follow up with [place] in 3 weeks. We spent a total of ( 32  ) minutes reviewing records and discussing these issues.  Gershon Crane, MD

## 2022-12-18 ENCOUNTER — Encounter: Payer: Self-pay | Admitting: Family Medicine

## 2022-12-20 NOTE — Telephone Encounter (Signed)
The best way to get the cholesterol down is to use the Zetia and a statin together (like Crestor). She will not be able to control it with diet alone

## 2022-12-22 ENCOUNTER — Telehealth: Payer: Self-pay | Admitting: Family Medicine

## 2022-12-22 NOTE — Telephone Encounter (Signed)
Reviewed lab results with patient.

## 2022-12-22 NOTE — Telephone Encounter (Signed)
Pt call and stated she is returning your call about her lab results and want a call back.

## 2023-01-02 NOTE — Telephone Encounter (Signed)
Call in Celebrex 200 mg to take BID as needed for pain, #60 with 2 rf

## 2023-01-03 MED ORDER — CELECOXIB 200 MG PO CAPS: 200.0000 mg | ORAL_CAPSULE | Freq: Two times a day (BID) | ORAL | 2 refills | Status: DC

## 2023-01-03 NOTE — Addendum Note (Signed)
Addended by: Philipp Deputy A on: 01/03/2023 10:55 AM   Modules accepted: Orders

## 2023-01-03 NOTE — Telephone Encounter (Signed)
Medication sent.

## 2023-01-06 ENCOUNTER — Telehealth: Payer: Self-pay | Admitting: Family Medicine

## 2023-01-06 NOTE — Telephone Encounter (Signed)
Patient dropped off document  Disability and Leave Provider Statement , to be filled out by provider. Patient requested to send it back via Fax and call pt to pick up within 5-days. Document is located in providers tray at front office.Please advise at Mobile [phone] (mobile)

## 2023-01-06 NOTE — Telephone Encounter (Signed)
Pt form received and placed in the red folder for Dr Clent Ridges to complete

## 2023-01-09 DIAGNOSIS — Z0279 Encounter for issue of other medical certificate: Secondary | ICD-10-CM

## 2023-01-10 NOTE — Telephone Encounter (Signed)
Pt notified that the form was faxed to [facility]

## 2023-01-11 NOTE — Telephone Encounter (Signed)
Pt paperwork has been completed and pt has been notified

## 2023-01-12 NOTE — Telephone Encounter (Signed)
Pt message sent to Dr Fry for advise 

## 2023-01-12 NOTE — Telephone Encounter (Signed)
Pt called the office requests for an extension of return to work date of after her f/u appointment on 01/19/23 since she did not return on 01/08/23. Pt has a f/u appointment to review her progress/ return to work on 01/19/23 with Dr Clent Ridges

## 2023-01-12 NOTE — Telephone Encounter (Signed)
Pt called again.  CMA was unavailable. Pt asked for a call back at [facility] earliest convenience.

## 2023-01-17 NOTE — Telephone Encounter (Signed)
We will wait and discuss this at her OV

## 2023-01-19 ENCOUNTER — Ambulatory Visit (INDEPENDENT_AMBULATORY_CARE_PROVIDER_SITE_OTHER): Payer: No Typology Code available for payment source | Admitting: Family Medicine

## 2023-01-19 ENCOUNTER — Encounter: Payer: Self-pay | Admitting: Family Medicine

## 2023-01-19 VITALS — BP 138/90 | HR 78 | Temp 98.0°F | Wt 148.0 lb

## 2023-01-19 DIAGNOSIS — E871 Hypo-osmolality and hyponatremia: Secondary | ICD-10-CM

## 2023-01-19 DIAGNOSIS — F419 Anxiety disorder, unspecified: Secondary | ICD-10-CM

## 2023-01-19 DIAGNOSIS — I10 Essential (primary) hypertension: Secondary | ICD-10-CM

## 2023-01-19 DIAGNOSIS — E782 Mixed hyperlipidemia: Secondary | ICD-10-CM

## 2023-01-19 NOTE — Progress Notes (Signed)
   Subjective:    Patient ID: Brittany [name], female    DOB: 02-10-1966, 57 y.o.   MRN: [id number]  HPI Here to follow up on hyponatremia, HTN, and anxiety. She is feeling a little better as far as the anxiety goes. We started her on Lexapro 10 mg daily 4 weeks ago, but there was some problem with [employer] apparently so she was only able to start this one week ago. She still sees her therapist. She says the anxiety is improving, but she has not felt up to returning to work yet (even though we had agreed on a return date of 01-09-23). Her BP has been stable. Her last sodium level on 12-16-22 was 140. Finally her recent lipid panel showed her LDL to be significantly elevated to 181.    Review of Systems  Constitutional: Negative.   Respiratory: Negative.    Cardiovascular: Negative.   Psychiatric/Behavioral:  Negative for agitation, behavioral problems, confusion, dysphoric mood and sleep disturbance. The patient is nervous/anxious.        Objective:   Physical Exam Constitutional:      Appearance: Normal appearance.  Cardiovascular:     Rate and Rhythm: Normal rate and regular rhythm.     Pulses: Normal pulses.     Heart sounds: Normal heart sounds.  Pulmonary:     Effort: Pulmonary effort is normal.     Breath sounds: Normal breath sounds.  Neurological:     General: No focal deficit present.     Mental Status: She is alert and oriented to person, place, and time.  Psychiatric:        Mood and Affect: Mood normal.        Behavior: Behavior normal.        Thought Content: Thought content normal.           Assessment & Plan:  Her HTN and hyponatremia are well controlled. For the hyperlipidemia, we will refer her to the Lipid Clinic. Her anxiety is still not well controlled, but of course she has only been taking Lexapro for one week. We will push back her return to work date to 01-30-25. Follow up as needed.  Gershon Crane, MD

## 2023-01-25 ENCOUNTER — Telehealth: Payer: Self-pay

## 2023-01-25 NOTE — Telephone Encounter (Signed)
Pt [employer] form and last office noted have been faxed as requested by pt. Confirmation received

## 2023-03-11 ENCOUNTER — Other Ambulatory Visit: Payer: Self-pay | Admitting: Family Medicine

## 2023-03-11 LAB — HM MAMMOGRAPHY

## 2023-03-13 ENCOUNTER — Encounter: Payer: Self-pay | Admitting: Family Medicine

## 2023-05-19 ENCOUNTER — Other Ambulatory Visit: Payer: Self-pay | Admitting: Family Medicine

## 2023-06-09 ENCOUNTER — Other Ambulatory Visit: Payer: Self-pay | Admitting: Family Medicine

## 2023-06-12 NOTE — Telephone Encounter (Signed)
Pt LOV was 01/19/23 Please advise

## 2023-06-16 ENCOUNTER — Ambulatory Visit (INDEPENDENT_AMBULATORY_CARE_PROVIDER_SITE_OTHER): Payer: No Typology Code available for payment source | Admitting: Internal Medicine

## 2023-06-16 VITALS — BP 182/100 | HR 104 | Ht 67.72 in | Wt 148.7 lb

## 2023-06-16 DIAGNOSIS — R030 Elevated blood-pressure reading, without diagnosis of hypertension: Secondary | ICD-10-CM

## 2023-06-16 DIAGNOSIS — M791 Myalgia, unspecified site: Secondary | ICD-10-CM | POA: Diagnosis not present

## 2023-06-16 DIAGNOSIS — E782 Mixed hyperlipidemia: Secondary | ICD-10-CM

## 2023-06-16 DIAGNOSIS — T466X5D Adverse effect of antihyperlipidemic and antiarteriosclerotic drugs, subsequent encounter: Secondary | ICD-10-CM

## 2023-06-16 NOTE — Patient Instructions (Addendum)
Medication Instructions:  NO CHANGES -- pending calcium score results  *If you need a refill on your cardiac medications before your next appointment, please call your pharmacy*   Lab Work:  PENDING any possible med changes  If you have labs (blood work) drawn today and your tests are completely normal, you will receive your results only by: MyChart Message (if you have MyChart) OR A paper copy in the mail If you have any lab test that is abnormal or we need to change your treatment, we will call you to review the results.   Testing/Procedures: Dr. Rennis Golden has ordered a CT coronary calcium score.   Test locations:  [facility]  [place] [facility]  This is $99 out of pocket.   Coronary CalciumScan A coronary calcium scan is an imaging test used to look for deposits of calcium and other fatty materials (plaques) in the inner lining of the blood vessels of the heart (coronary arteries). These deposits of calcium and plaques can partly clog and narrow the coronary arteries without producing any symptoms or warning signs. This puts a person at risk for a heart attack. This test can detect these deposits before symptoms develop. Tell a health care provider about: Any allergies you have. All medicines you are taking, including vitamins, herbs, eye drops, creams, and over-the-counter medicines. Any problems you or family members have had with anesthetic medicines. Any blood disorders you have. Any surgeries you have had. Any medical conditions you have. Whether you are pregnant or may be pregnant. What are the risks? Generally, this is a safe procedure. However, problems may occur, including: Harm to a pregnant woman and her unborn baby. This test involves the use of radiation. Radiation exposure can be dangerous to a pregnant woman and her unborn baby. If you are pregnant, you generally should not have  this procedure done. Slight increase in the risk of cancer. This is because of the radiation involved in the test. What happens before the procedure? No preparation is needed for this procedure. What happens during the procedure? You will undress and remove any jewelry around your neck or chest. You will put on a hospital gown. Sticky electrodes will be placed on your chest. The electrodes will be connected to an electrocardiogram (ECG) machine to record a tracing of the electrical activity of your heart. A CT scanner will take pictures of your heart. During this time, you will be asked to lie still and hold your breath for 2-3 seconds while a picture of your heart is being taken. The procedure may vary among health care providers and hospitals. What happens after the procedure? You can get dressed. You can return to your normal activities. It is up to you to get the results of your test. Ask your health care provider, or the department that is doing the test, when your results will be ready. Summary A coronary calcium scan is an imaging test used to look for deposits of calcium and other fatty materials (plaques) in the inner lining of the blood vessels of the heart (coronary arteries). Generally, this is a safe procedure. Tell your health care provider if you are pregnant or may be pregnant. No preparation is needed for this procedure. A CT scanner will take pictures of your heart. You can return to your normal activities after the scan is done. This information is not intended to replace advice given to you by your health care  provider. Make sure you discuss any questions you have with your health care provider. Document Released: 11/05/2007 Document Revised: 03/28/2016 Document Reviewed: 03/28/2016 Elsevier Interactive Patient Education  2017 [employer].    Follow-Up: At [facility], you and your health needs are our priority.  As part of our continuing mission to provide  you with exceptional heart care, we have created designated Provider Care Teams.  These Care Teams include your primary Cardiologist (physician) and Advanced Practice Providers (APPs -  Physician Assistants and Nurse Practitioners) who all work together to provide you with the care you need, when you need it.  We recommend signing up for the patient portal called "MyChart".  Sign up information is provided on this After Visit Summary.  MyChart is used to connect with patients for Virtual Visits (Telemedicine).  Patients are able to view lab/test results, encounter notes, upcoming appointments, etc.  Non-urgent messages can be sent to your provider as well.   To learn more about what you can do with MyChart, go to [email or web address].    Your next appointment:    AS NEEDED with Dr. Rennis Golden -- pending calcium score results and if med changes are needed          Check BP at home -- 1-2 times daily for 7-10 days -- Send home BP readings via MyChart  HOW TO TAKE YOUR BLOOD PRESSURE: Rest 5 minutes before taking your blood pressure. Don't smoke or drink caffeinated beverages for at least 30 minutes before. Take your blood pressure before (not after) you eat. Sit comfortably with your back supported and both feet on the floor (don't cross your legs). Elevate your arm to heart level on a table or a desk. Use the proper sized cuff. It should fit smoothly and snugly around your bare upper arm. There should be enough room to slip a fingertip under the cuff. The bottom edge of the cuff should be 1 inch above the crease of the elbow. Ideally, take 3 measurements at one sitting and record the average.

## 2023-06-16 NOTE — Progress Notes (Unsigned)
LIPID CLINIC CONSULT NOTE  Chief Complaint:  Manage dyslipidemia  Primary Care Physician: Nelwyn Salisbury, MD  Primary Cardiologist:  None  HPI:  Brittany [name] is a 58 y.o. female who is being seen today for the evaluation of dyslipidemia at the request of Nelwyn Salisbury, MD. this is a pleasant 58 year old female kindly referred for evaluation management of dyslipidemia.  PMHx:  Past Medical History:  Diagnosis Date   Diabetes mellitus    type II   GERD (gastroesophageal reflux disease)    Glaucoma    sees Dr. Arvil Chaco   Hyperlipidemia    Hypertension     Past Surgical History:  Procedure Laterality Date   COLONOSCOPY  04/22/2016   per Dr. Loreta Ave, adenomatous polyps, repeat in 5 yrs    ESOPHAGOGASTRODUODENOSCOPY  04/22/2016   per Dr. Loreta Ave, normal except small hiatal hernia    VAGINAL HYSTERECTOMY     ovaries intact    FAMHx:  Family History  Problem Relation Age of Onset   Arthritis Other    Heart disease Other    Diabetes Other    Hyperlipidemia Other    Hypertension Other    Stroke Other     SOCHx:   reports that she has never smoked. She has never used smokeless tobacco. She reports current alcohol use. She reports that she does not use drugs.  ALLERGIES:  No Known Allergies  ROS: {Ros - complete:30496}  HOME MEDS: Current Outpatient Medications on File Prior to Visit  Medication Sig Dispense Refill   acetaminophen (TYLENOL 8 HOUR ARTHRITIS PAIN) 650 MG CR tablet Take 1,300 mg by mouth as needed for pain. Patient used once to twice daily depending on pain.     aspirin 81 MG chewable tablet Chew 1 tablet (81 mg total) by mouth daily. 30 tablet 0   blood glucose meter kit and supplies KIT Dispense based on patient and insurance preference. Use up to four times daily as directed. (FOR ICD-9 250.00, 250.01). 1 each 0   Cholecalciferol (VITAMIN D-3 PO) Take 1 capsule by mouth daily.     ezetimibe (ZETIA) 10 MG tablet TAKE 1 TABLET BY MOUTH  DAILY 90 tablet 3   glucose blood (ONETOUCH VERIO) test strip TEST ONCE DAILY 100 strip 1   LORazepam (ATIVAN) 2 MG tablet TAKE 1 TABLET BY MOUTH EVERY 8  HOURS AS NEEDED FOR ANXIETY 270 tablet 1   metFORMIN (GLUCOPHAGE) 1000 MG tablet TAKE ONE-HALF TABLET BY MOUTH  TWICE DAILY WITH MEALS (Patient taking differently: Take 500 mg by mouth 2 (two) times daily with a meal.) 90 tablet 3   omeprazole (PRILOSEC) 40 MG capsule Take 1 capsule (40 mg total) by mouth daily. 90 capsule 3   ONETOUCH DELICA LANCETS FINE MISC TEST ONCE A DAY 100 each 1   bimatoprost (LUMIGAN) 0.01 % SOLN Place 1 drop into both eyes at bedtime. (Patient not taking: Reported on 06/16/2023)     celecoxib (CELEBREX) 200 MG capsule TAKE 1 CAPSULE BY MOUTH TWICE  DAILY (Patient not taking: Reported on 06/16/2023) 180 capsule 3   escitalopram (LEXAPRO) 10 MG tablet Take 1 tablet (10 mg total) by mouth daily. (Patient not taking: Reported on 06/16/2023) 90 tablet 3   ondansetron (ZOFRAN) 8 MG tablet Take 1 tablet (8 mg total) by mouth every 6 (six) hours as needed for nausea or vomiting. (Patient not taking: Reported on 06/16/2023) 60 tablet 0   No current facility-administered medications on file prior to visit.  LABS/IMAGING: No results found for this or any previous visit (from the past 48 hours). No results found.  LIPID PANEL:    Component Value Date/Time   CHOL 268 (H) 12/16/2022 0929   TRIG 86.0 12/16/2022 0929   HDL 70.00 12/16/2022 0929   CHOLHDL 4 12/16/2022 0929   VLDL 17.2 12/16/2022 0929   LDLCALC 181 (H) 12/16/2022 0929   LDLDIRECT 161.7 06/14/2013 0941    WEIGHTS: Wt Readings from Last 3 Encounters:  06/16/23 148 lb 11.2 oz (67.4 kg)  01/19/23 148 lb (67.1 kg)  12/16/22 149 lb 6.4 oz (67.8 kg)    VITALS: BP (!) 182/100   Pulse (!) 104   Ht 5' 7.72" (1.72 m)   Wt 148 lb 11.2 oz (67.4 kg)   SpO2 97%   BMI 22.80 kg/m   EXAM: {Physical GNFA:2130865}  EKG: *** - personally  reviewed  ASSESSMENT: ***  PLAN: 1.   ***  Chrystie Nose, MD, [facility], FACP  [place]  [facility]  Medical Director of the Advanced Lipid Disorders &  Cardiovascular Risk Reduction Clinic Diplomate of the American Board of Clinical Lipidology Attending Cardiologist  Direct Dial: [phone]  Fax: [phone]  Website:  [email or web address] 06/16/2023, 4:56 PM

## 2023-07-14 ENCOUNTER — Other Ambulatory Visit: Payer: Self-pay | Admitting: Family Medicine

## 2023-07-14 DIAGNOSIS — E119 Type 2 diabetes mellitus without complications: Secondary | ICD-10-CM

## 2023-07-25 ENCOUNTER — Encounter: Payer: Self-pay | Admitting: *Deleted

## 2023-07-25 ENCOUNTER — Ambulatory Visit (HOSPITAL_BASED_OUTPATIENT_CLINIC_OR_DEPARTMENT_OTHER)
Admission: RE | Admit: 2023-07-25 | Discharge: 2023-07-25 | Disposition: A | Discharge status: Home / Self Care | Payer: Self-pay | Source: Ambulatory Visit | Attending: Internal Medicine | Admitting: Internal Medicine

## 2023-07-25 DIAGNOSIS — E782 Mixed hyperlipidemia: Secondary | ICD-10-CM | POA: Insufficient documentation

## 2023-11-20 ENCOUNTER — Other Ambulatory Visit: Payer: Self-pay | Admitting: Family Medicine

## 2023-12-29 ENCOUNTER — Ambulatory Visit: Payer: Self-pay

## 2023-12-29 NOTE — Telephone Encounter (Signed)
 FYI Only or Action Required?: FYI only for provider.  Patient was last seen in primary care on 01/19/2023 by Brittany [name] LABOR, MD.  Called Nurse Triage reporting Insect Bite.  Symptoms began several days ago.  Interventions attempted: OTC medications: Anti itch Spray.  Symptoms are: unchanged.  Triage Disposition: See Physician Within 24 Hours  Patient/caregiver understands and will follow disposition?: Unsure  **See note below**        Copied from CRM #[id number]. Topic: Clinical - Red Word Triage >> Dec 29, 2023  4:36 PM Brittany [name] wrote: Red Word that prompted transfer to Nurse Triage: Patient is calling in stating she got bite by something but is unsure what bite her. Patient states it is now swelling, itching, and red. Reason for Disposition  [1] Red or very tender (to touch) area AND [2] started over 24 hours after the bite  Answer Assessment - Initial Assessment Questions 1. TYPE of INSECT: What type of insect was it?      Unsure   2. ONSET: When did you get bitten?      Last Monday  3. LOCATION: Where is the insect bite located?      Left leg, by the calf area  4. REDNESS: Is the area red or pink? If Yes, ask: What size is the area of redness? (inches or cm). When did the redness start?     Redness  5. PAIN: Is there any pain? If Yes, ask: How bad is the pain? (Scale 0-10; or none, mild, moderate, severe)     Pain with touch, 3/10  6. ITCHING: Does it itch? If Yes, ask: How bad is the itch?      Yes, moderate  7. SWELLING: How big is the swelling? (e.g., inches, cm, or compare to coins)     Moderate  8. OTHER SYMPTOMS: Do you have any other symptoms?  (e.g., difficulty breathing, fever, hives)  No   Tried OTC anti-spray. Pt. Offered virtual UC as office closes in 20 minutes. She refused, patient then referred to local UC, she is undecided if she will go.  Protocols used: Insect Bite-A-AH

## 2024-01-01 NOTE — Telephone Encounter (Signed)
 Called pt f/u regarding the triage notes pt stated that the bug bite site was getting better from cleaning with alcohol and applying ana anti itch cream, pt denies any other symptoms. Pt declined an OV with a provider in the office pt stated that she will keep a close eye on how sh is doing and if not feeling better pt will call to schedule appointment

## 2024-02-29 LAB — OPHTHALMOLOGY REPORT-SCANNED

## 2024-04-06 LAB — HM MAMMOGRAPHY

## 2024-04-09 ENCOUNTER — Encounter: Payer: Self-pay | Admitting: Family Medicine

## 2024-06-26 ENCOUNTER — Encounter: Admitting: Family Medicine
